# Patient Record
Sex: Female | Born: 1975 | Race: Black or African American | Hispanic: No | Marital: Married | Smoking: Light tobacco smoker
Health system: Southern US, Community
[De-identification: ages and names within clinical notes are randomized; demographics above are authoritative.]

## PROBLEM LIST (undated history)

## (undated) DIAGNOSIS — D649 Anemia, unspecified: Secondary | ICD-10-CM

## (undated) DIAGNOSIS — Z803 Family history of malignant neoplasm of breast: Secondary | ICD-10-CM

## (undated) DIAGNOSIS — E559 Vitamin D deficiency, unspecified: Secondary | ICD-10-CM

## (undated) DIAGNOSIS — N809 Endometriosis, unspecified: Secondary | ICD-10-CM

## (undated) DIAGNOSIS — T7840XA Allergy, unspecified, initial encounter: Secondary | ICD-10-CM

## (undated) DIAGNOSIS — E669 Obesity, unspecified: Secondary | ICD-10-CM

## (undated) DIAGNOSIS — Z5189 Encounter for other specified aftercare: Secondary | ICD-10-CM

## (undated) DIAGNOSIS — J45909 Unspecified asthma, uncomplicated: Secondary | ICD-10-CM

## (undated) DIAGNOSIS — F419 Anxiety disorder, unspecified: Secondary | ICD-10-CM

## (undated) DIAGNOSIS — B019 Varicella without complication: Secondary | ICD-10-CM

## (undated) HISTORY — DX: Endometriosis, unspecified: N80.9

## (undated) HISTORY — DX: Family history of malignant neoplasm of breast: Z80.3

## (undated) HISTORY — DX: Varicella without complication: B01.9

## (undated) HISTORY — DX: Vitamin D deficiency, unspecified: E55.9

## (undated) HISTORY — DX: Encounter for other specified aftercare: Z51.89

## (undated) HISTORY — PX: BREAST SURGERY: SHX581

## (undated) HISTORY — DX: Anemia, unspecified: D64.9

## (undated) HISTORY — DX: Unspecified asthma, uncomplicated: J45.909

## (undated) HISTORY — DX: Obesity, unspecified: E66.9

## (undated) HISTORY — DX: Anxiety disorder, unspecified: F41.9

## (undated) HISTORY — DX: Allergy, unspecified, initial encounter: T78.40XA

---

## 2004-08-02 HISTORY — PX: BREAST BIOPSY: SHX20

## 2005-08-02 HISTORY — PX: OTHER SURGICAL HISTORY: SHX169

## 2005-08-02 HISTORY — PX: TUBAL LIGATION: SHX77

## 2011-02-11 ENCOUNTER — Emergency Department (HOSPITAL_COMMUNITY)
Admission: EM | Admit: 2011-02-11 | Discharge: 2011-02-11 | Disposition: A | Discharge status: Home / Self Care | Payer: 59 | Attending: Emergency Medicine | Admitting: Emergency Medicine

## 2011-02-11 ENCOUNTER — Emergency Department (HOSPITAL_COMMUNITY): Payer: 59

## 2011-02-11 DIAGNOSIS — R0602 Shortness of breath: Secondary | ICD-10-CM | POA: Insufficient documentation

## 2011-02-11 DIAGNOSIS — I498 Other specified cardiac arrhythmias: Secondary | ICD-10-CM | POA: Insufficient documentation

## 2011-02-11 DIAGNOSIS — J45909 Unspecified asthma, uncomplicated: Secondary | ICD-10-CM | POA: Insufficient documentation

## 2011-02-11 DIAGNOSIS — R0789 Other chest pain: Secondary | ICD-10-CM | POA: Insufficient documentation

## 2011-10-27 ENCOUNTER — Ambulatory Visit: Payer: Self-pay | Admitting: Podiatry

## 2012-01-18 ENCOUNTER — Emergency Department: Payer: Self-pay | Admitting: Emergency Medicine

## 2012-01-18 LAB — CBC
MCH: 29.3 pg (ref 26.0–34.0)
MCHC: 34 g/dL (ref 32.0–36.0)
Platelet: 250 10*3/uL (ref 150–440)
RBC: 4.95 10*6/uL (ref 3.80–5.20)
RDW: 13.8 % (ref 11.5–14.5)

## 2012-01-18 LAB — HEPATIC FUNCTION PANEL A (ARMC)
Albumin: 4 g/dL (ref 3.4–5.0)
Alkaline Phosphatase: 48 U/L — ABNORMAL LOW (ref 50–136)
Bilirubin, Direct: 0.1 mg/dL (ref 0.00–0.20)
SGOT(AST): 24 U/L (ref 15–37)
SGPT (ALT): 26 U/L
Total Protein: 8.2 g/dL (ref 6.4–8.2)

## 2012-01-18 LAB — CK TOTAL AND CKMB (NOT AT ARMC): CK, Total: 74 U/L (ref 21–215)

## 2012-01-18 LAB — BASIC METABOLIC PANEL
Anion Gap: 5 — ABNORMAL LOW (ref 7–16)
BUN: 4 mg/dL — ABNORMAL LOW (ref 7–18)
Co2: 27 mmol/L (ref 21–32)
Creatinine: 0.96 mg/dL (ref 0.60–1.30)
Potassium: 3.7 mmol/L (ref 3.5–5.1)

## 2012-08-27 ENCOUNTER — Emergency Department: Payer: Self-pay | Admitting: Unknown Physician Specialty

## 2012-08-27 LAB — CBC
MCHC: 34.7 g/dL (ref 32.0–36.0)
MCV: 87 fL (ref 80–100)
Platelet: 249 10*3/uL (ref 150–440)
RBC: 5.19 10*6/uL (ref 3.80–5.20)
WBC: 8.1 10*3/uL (ref 3.6–11.0)

## 2012-08-27 LAB — URINALYSIS, COMPLETE
Blood: NEGATIVE
Glucose,UR: NEGATIVE mg/dL (ref 0–75)
Protein: 30
WBC UR: 19 /HPF (ref 0–5)

## 2012-08-27 LAB — BASIC METABOLIC PANEL
Anion Gap: 8 (ref 7–16)
BUN: 4 mg/dL — ABNORMAL LOW (ref 7–18)
Chloride: 107 mmol/L (ref 98–107)
Co2: 24 mmol/L (ref 21–32)
EGFR (Non-African Amer.): >60

## 2012-08-27 LAB — HEPATIC FUNCTION PANEL A (ARMC)
Albumin: 4.2 g/dL (ref 3.4–5.0)
Bilirubin, Direct: 0.1 mg/dL (ref 0.00–0.20)
Total Protein: 8.4 g/dL — ABNORMAL HIGH (ref 6.4–8.2)

## 2012-08-27 LAB — PREGNANCY, URINE: Pregnancy Test, Urine: NEGATIVE m[IU]/mL

## 2012-11-23 ENCOUNTER — Encounter: Payer: 59 | Admitting: Obstetrics & Gynecology

## 2012-12-04 ENCOUNTER — Ambulatory Visit (INDEPENDENT_AMBULATORY_CARE_PROVIDER_SITE_OTHER): Payer: 59 | Admitting: Obstetrics & Gynecology

## 2012-12-04 ENCOUNTER — Other Ambulatory Visit: Payer: Self-pay | Admitting: Obstetrics & Gynecology

## 2012-12-04 ENCOUNTER — Encounter: Payer: Self-pay | Admitting: Obstetrics & Gynecology

## 2012-12-04 VITALS — BP 104/69 | HR 53 | Ht 62.0 in | Wt 183.0 lb

## 2012-12-04 DIAGNOSIS — Z0000 Encounter for general adult medical examination without abnormal findings: Secondary | ICD-10-CM

## 2012-12-04 DIAGNOSIS — Z01419 Encounter for gynecological examination (general) (routine) without abnormal findings: Secondary | ICD-10-CM

## 2012-12-04 NOTE — Progress Notes (Signed)
Subjective:    Peggy Pierce is a 37 y.o. female who presents for an annual exam. The patient has no complaints today. She wants screening labs, pap, and mammogram. She says that her periods for the last 4 months have become increasingly painful. She is taking IBU 400 mg.The patient is sexually active. GYN screening history: last pap: was normal. The patient wears seatbelts: yes. The patient participates in regular exercise: no. Has the patient ever been transfused or tattooed?: yes.(transfusion pp) The patient reports that there is not domestic violence in her life.   Menstrual History: OB History   Grav Para Term Preterm Abortions TAB SAB Ect Mult Living                  Menarche age: 64  Patient's last menstrual period was 11/23/2012.    The following portions of the patient's history were reviewed and updated as appropriate: allergies, current medications, past family history, past medical history, past social history, past surgical history and problem list.  Review of Systems A comprehensive review of systems was negative. Married for 3 years, works for [employer]. She denies dyspareunia. Her mother had breast cancer at 11 yo.   Objective:    BP 104/69  Pulse 53  Ht 5\' 2"  (1.575 m)  Wt 183 lb (83.008 kg)  BMI 33.46 kg/m2  LMP 11/23/2012  General Appearance:    Alert, cooperative, no distress, appears stated age  Head:    Normocephalic, without obvious abnormality, atraumatic  Eyes:    PERRL, conjunctiva/corneas clear, EOM's intact, fundi    benign, both eyes  Ears:    Normal TM's and external ear canals, both ears  Nose:   Nares normal, septum midline, mucosa normal, no drainage    or sinus tenderness  Throat:   Lips, mucosa, and tongue normal; teeth and gums normal  Neck:   Supple, symmetrical, trachea midline, no adenopathy;    thyroid:  no enlargement/tenderness/nodules; no carotid   bruit or JVD  Back:     Symmetric, no curvature, ROM normal, no CVA tenderness  Lungs:      Clear to auscultation bilaterally, respirations unlabored  Chest Wall:    No tenderness or deformity   Heart:    Regular rate and rhythm, S1 and S2 normal, no murmur, rub   or gallop  Breast Exam:    No tenderness, masses, or nipple abnormality  Abdomen:     Soft, non-tender, bowel sounds active all four quadrants,    no masses, no organomegaly  Genitalia:    Normal female without lesion, discharge or tenderness, NSSR, NT, mobile, normal adnexa     Extremities:   Extremities normal, atraumatic, no cyanosis or edema  Pulses:   2+ and symmetric all extremities  Skin:   Skin color, texture, turgor normal, no rashes or lesions  Lymph nodes:   Cervical, supraclavicular, and axillary nodes normal  Neurologic:   CNII-XII intact, normal strength, sensation and reflexes    throughout  .    Assessment:    Healthy female exam.    Plan:     Mammogram. Thin prep Pap smear. with HPV cotesting Screening labs when fasting

## 2012-12-14 LAB — PAP LB, HPV-H+LR
HPV DNA High Risk: NEGATIVE
HPV DNA Low Risk: NEGATIVE

## 2013-01-26 ENCOUNTER — Other Ambulatory Visit: Payer: 59

## 2013-06-14 IMAGING — CT CT ABD-PELV W/O CM
1 of 2 series · 15 of 32 positions shown, 19 images · non-contrast
Comparison: none

REASON FOR EXAM: (1) right flank pain; (2) right flank pain
COMMENTS:

[Series 2: 3mm soft tissue · axial · 0.70mm/px · z∈[-422,-24]mm · 15 of 145 slices shown, 19 images]
[im 6/145  soft-tissue]
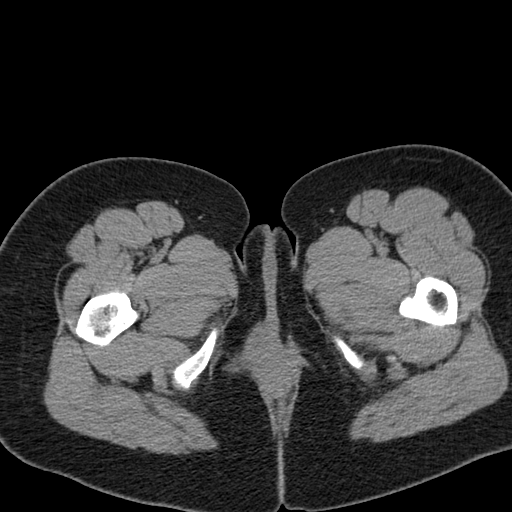
[im 6/145  bone]
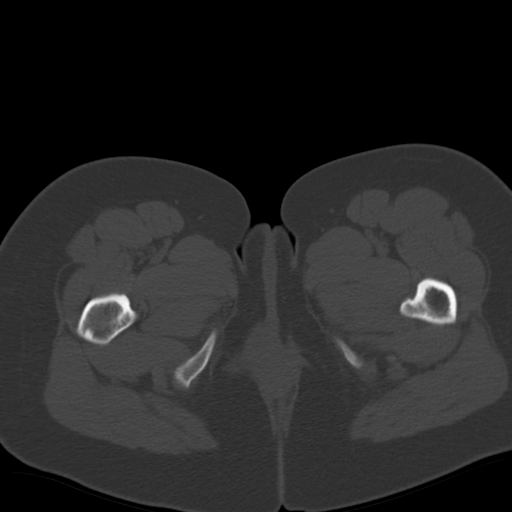
[im 18/145  soft-tissue]
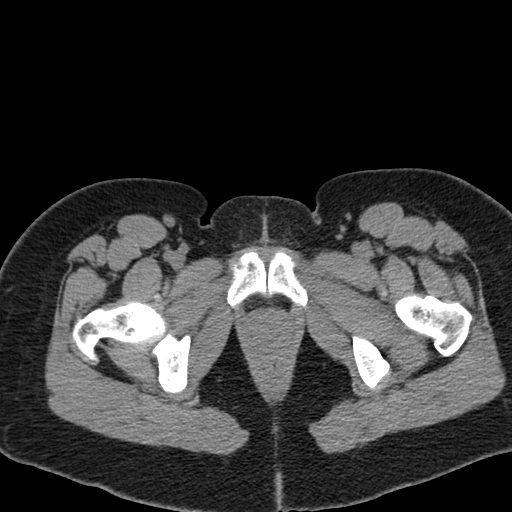
[im 29/145  soft-tissue]
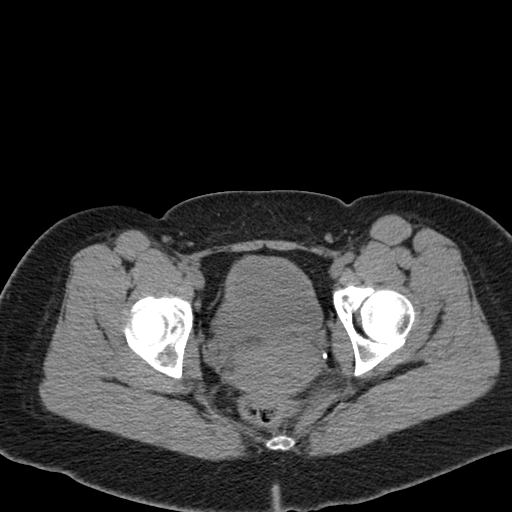
[im 41/145  soft-tissue]
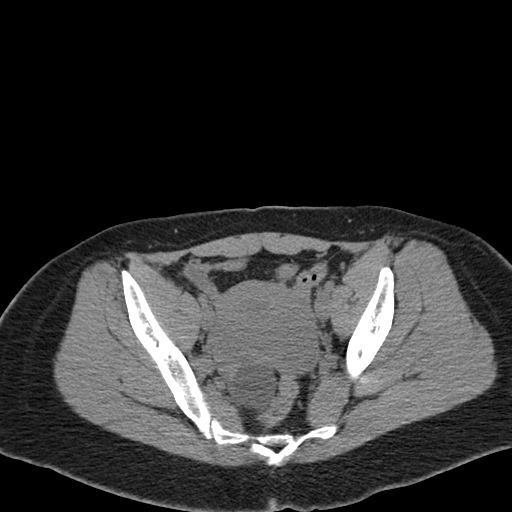
[im 52/145  soft-tissue]
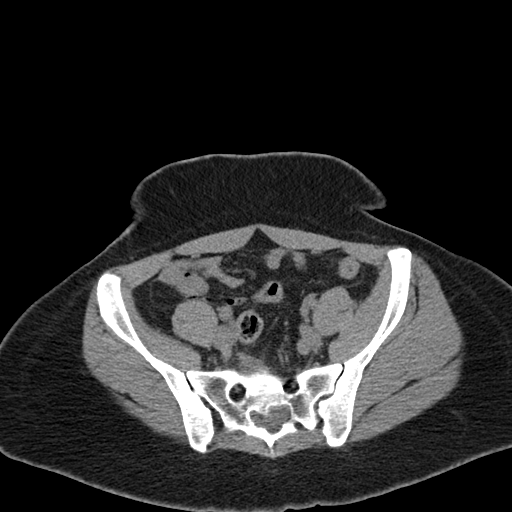
[im 64/145  soft-tissue]
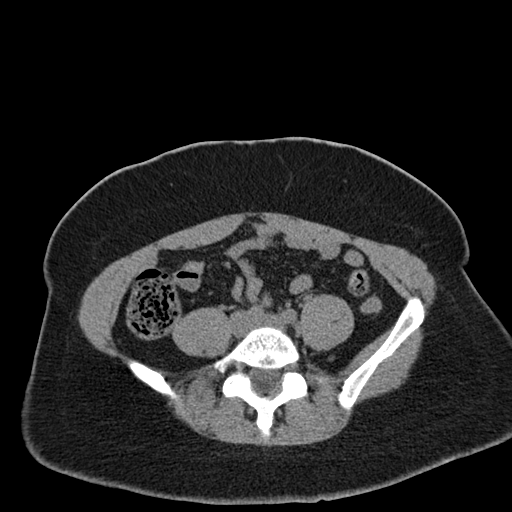
[im 75/145  soft-tissue]
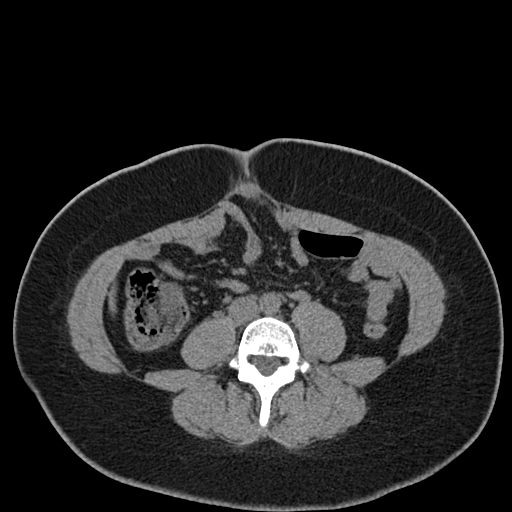
[im 81/145  soft-tissue]
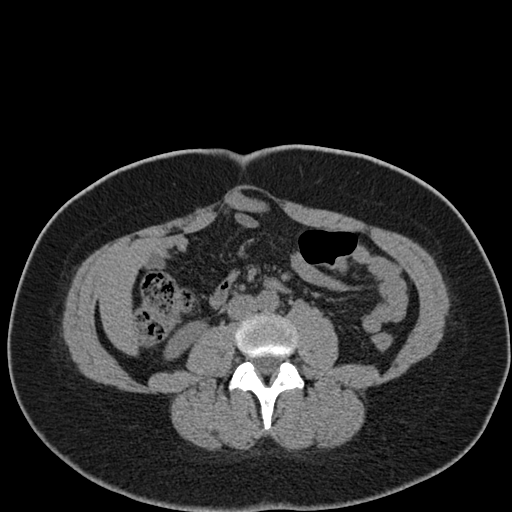
[im 93/145  soft-tissue]
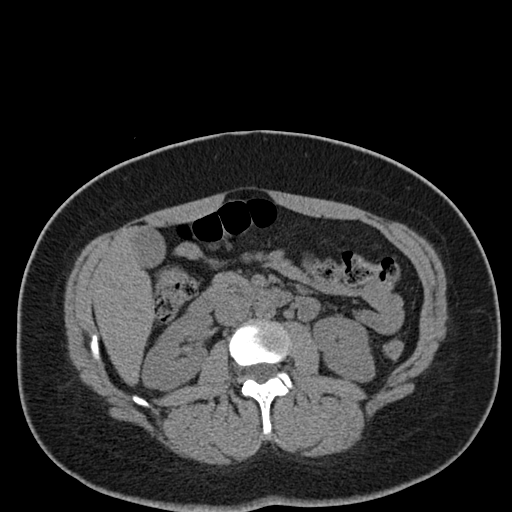
[im 93/145  bone]
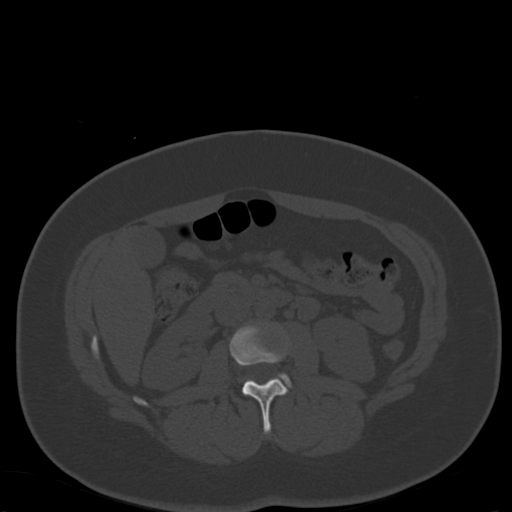
[im 104/145  soft-tissue]
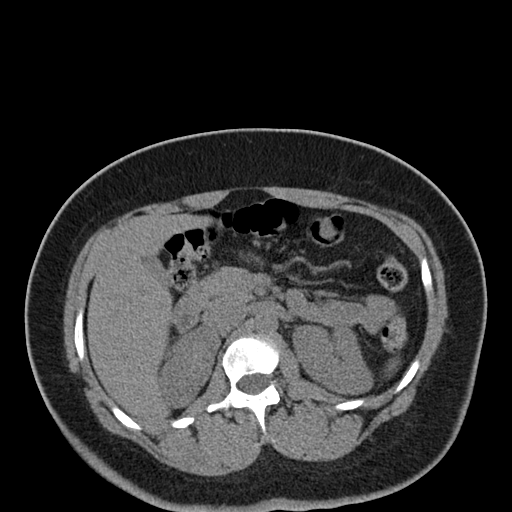
[im 116/145  soft-tissue]
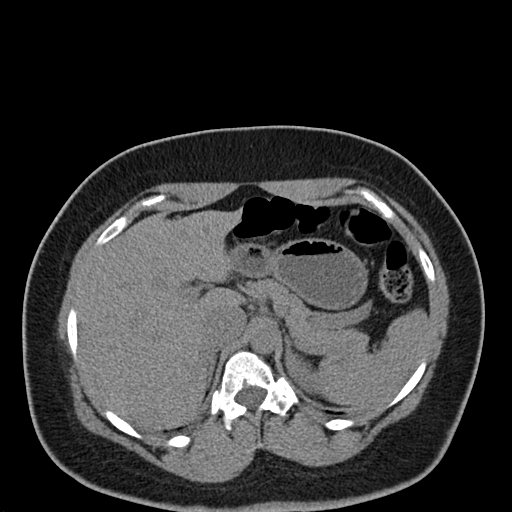
[im 121/145  lung]
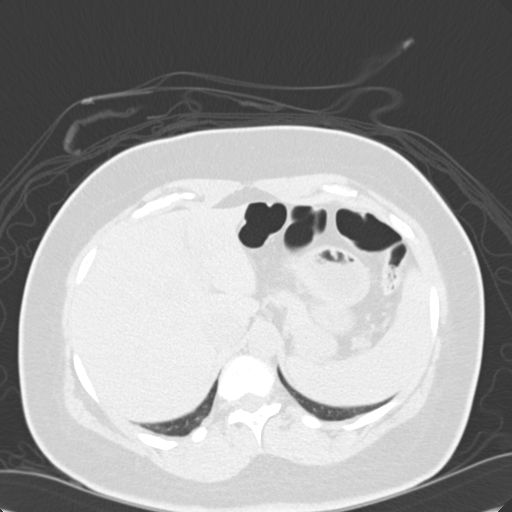
[im 127/145  soft-tissue]
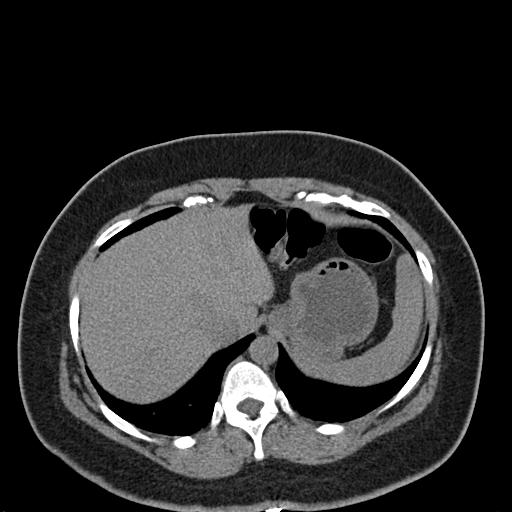
[im 127/145  lung]
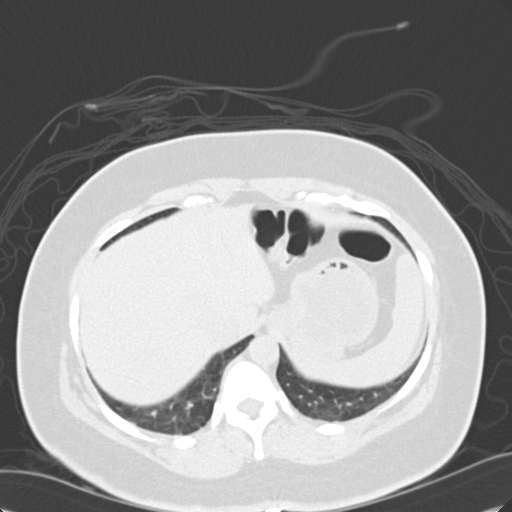
[im 133/145  lung]
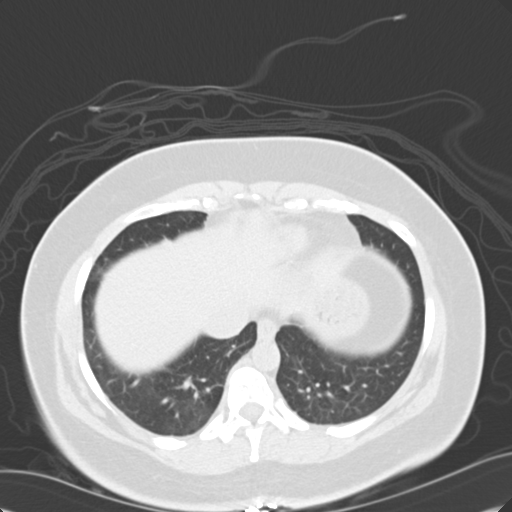
[im 139/145  soft-tissue]
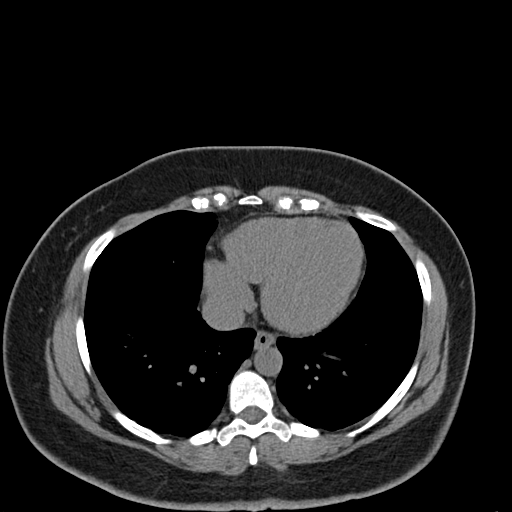
[im 139/145  lung]
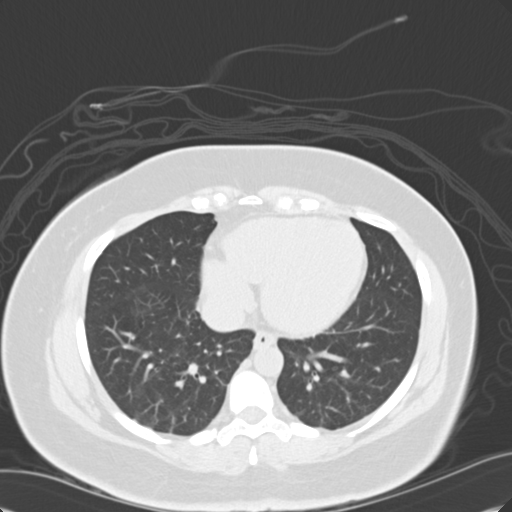

[15 of 32 positions shown; findings below may reference images not displayed]

PROCEDURE:     CT  - CT ABDOMEN AND PELVIS W[DATE]  [DATE]

RESULT:     CT of the abdomen and pelvis utilizing a noncontrast renal stone
protocol is reconstructed at 3 mm slice thickness in the axial plane. There
is no previous similar study for comparison.

There is no hydronephrosis or nephrolithiasis. There is a cyst in the pelvis
posteriorly with a diameter of 3.25 cm and the hamster reading of -11. This
may arise from the right ovary. There does not appear to be significant free
fluid. There is a small to moderate amount of urine in the bladder. There
appears to be a normal appendix. There is no acute inflammatory stranding.
No discrete abscess is evident. No radiopaque gallstones are evident. The
liver, spleen, pancreas, adrenal glands, aorta and kidneys as well as the
bowel otherwise appear to be unremarkable. The bony structures appear within
normal limits.
IMPRESSION: 1. Possible right adnexal cyst. Otherwise unremarkable exam. The lung bases
appear to be clear.

[REDACTED]

## 2016-04-15 ENCOUNTER — Ambulatory Visit (INDEPENDENT_AMBULATORY_CARE_PROVIDER_SITE_OTHER): Payer: 59 | Admitting: Family Medicine

## 2016-04-15 ENCOUNTER — Encounter: Payer: Self-pay | Admitting: Family Medicine

## 2016-04-15 VITALS — BP 134/86 | HR 55 | Temp 98.3°F | Ht 63.0 in | Wt 178.5 lb

## 2016-04-15 DIAGNOSIS — E669 Obesity, unspecified: Secondary | ICD-10-CM | POA: Diagnosis not present

## 2016-04-15 DIAGNOSIS — L918 Other hypertrophic disorders of the skin: Secondary | ICD-10-CM

## 2016-04-15 DIAGNOSIS — M25551 Pain in right hip: Secondary | ICD-10-CM

## 2016-04-15 MED ORDER — DICLOFENAC SODIUM 75 MG PO TBEC: 75.0000 mg | DELAYED_RELEASE_TABLET | Freq: Two times a day (BID) | ORAL | 0 refills | Status: DC

## 2016-04-15 NOTE — Progress Notes (Signed)
Pre visit review using our clinic review tool, if applicable. No additional management support is needed unless otherwise documented below in the visit note. 

## 2016-04-15 NOTE — Patient Instructions (Signed)
Use the diclofenac as needed.  Follow up at your convenience regarding the skin tags.  Take care  Dr. Lacinda Axon   Health Maintenance, Female Adopting a healthy lifestyle and getting preventive care can go a long way to promote health and wellness. Talk with your health care provider about what schedule of regular examinations is right for you. This is a good chance for you to check in with your provider about disease prevention and staying healthy. In between checkups, there are plenty of things you can do on your own. Experts have done a lot of research about which lifestyle changes and preventive measures are most likely to keep you healthy. Ask your health care provider for more information. WEIGHT AND DIET  Eat a healthy diet  Be sure to include plenty of vegetables, fruits, low-fat dairy products, and lean protein.  Do not eat a lot of foods high in solid fats, added sugars, or salt.  Get regular exercise. This is one of the most important things you can do for your health.  Most adults should exercise for at least 150 minutes each week. The exercise should increase your heart rate and make you sweat (moderate-intensity exercise).  Most adults should also do strengthening exercises at least twice a week. This is in addition to the moderate-intensity exercise.  Maintain a healthy weight  Body mass index (BMI) is a measurement that can be used to identify possible weight problems. It estimates body fat based on height and weight. Your health care provider can help determine your BMI and help you achieve or maintain a healthy weight.  For females 11 years of age and older:   A BMI below 18.5 is considered underweight.  A BMI of 18.5 to 24.9 is normal.  A BMI of 25 to 29.9 is considered overweight.  A BMI of 30 and above is considered obese.  Watch levels of cholesterol and blood lipids  You should start having your blood tested for lipids and cholesterol at 38 years of age, then  have this test every 5 years.  You may need to have your cholesterol levels checked more often if:  Your lipid or cholesterol levels are high.  You are older than 37 years of age.  You are at high risk for heart disease.  CANCER SCREENING   Lung Cancer  Lung cancer screening is recommended for adults 78-33 years old who are at high risk for lung cancer because of a history of smoking.  A yearly low-dose CT scan of the lungs is recommended for people who:  Currently smoke.  Have quit within the past 15 years.  Have at least a 30-pack-year history of smoking. A pack year is smoking an average of one pack of cigarettes a day for 1 year.  Yearly screening should continue until it has been 15 years since you quit.  Yearly screening should stop if you develop a health problem that would prevent you from having lung cancer treatment.  Breast Cancer  Practice breast self-awareness. This means understanding how your breasts normally appear and feel.  It also means doing regular breast self-exams. Let your health care provider know about any changes, no matter how small.  If you are in your 20s or 30s, you should have a clinical breast exam (CBE) by a health care provider every 1-3 years as part of a regular health exam.  If you are 9 or older, have a CBE every year. Also consider having a breast X-ray (mammogram) every  year.  If you have a family history of breast cancer, talk to your health care provider about genetic screening.  If you are at high risk for breast cancer, talk to your health care provider about having an MRI and a mammogram every year.  Breast cancer gene (BRCA) assessment is recommended for women who have family members with BRCA-related cancers. BRCA-related cancers include:  Breast.  Ovarian.  Tubal.  Peritoneal cancers.  Results of the assessment will determine the need for genetic counseling and BRCA1 and BRCA2 testing. Cervical Cancer Your health  care provider may recommend that you be screened regularly for cancer of the pelvic organs (ovaries, uterus, and vagina). This screening involves a pelvic examination, including checking for microscopic changes to the surface of your cervix (Pap test). You may be encouraged to have this screening done every 3 years, beginning at age 73.  For women ages 61-65, health care providers may recommend pelvic exams and Pap testing every 3 years, or they may recommend the Pap and pelvic exam, combined with testing for human papilloma virus (HPV), every 5 years. Some types of HPV increase your risk of cervical cancer. Testing for HPV may also be done on women of any age with unclear Pap test results.  Other health care providers may not recommend any screening for nonpregnant women who are considered low risk for pelvic cancer and who do not have symptoms. Ask your health care provider if a screening pelvic exam is right for you.  If you have had past treatment for cervical cancer or a condition that could lead to cancer, you need Pap tests and screening for cancer for at least 20 years after your treatment. If Pap tests have been discontinued, your risk factors (such as having a new sexual partner) need to be reassessed to determine if screening should resume. Some women have medical problems that increase the chance of getting cervical cancer. In these cases, your health care provider may recommend more frequent screening and Pap tests. Colorectal Cancer  This type of cancer can be detected and often prevented.  Routine colorectal cancer screening usually begins at 38 years of age and continues through 38 years of age.  Your health care provider may recommend screening at an earlier age if you have risk factors for colon cancer.  Your health care provider may also recommend using home test kits to check for hidden blood in the stool.  A small camera at the end of a tube can be used to examine your colon  directly (sigmoidoscopy or colonoscopy). This is done to check for the earliest forms of colorectal cancer.  Routine screening usually begins at age 35.  Direct examination of the colon should be repeated every 5-10 years through 38 years of age. However, you may need to be screened more often if early forms of precancerous polyps or small growths are found. Skin Cancer  Check your skin from head to toe regularly.  Tell your health care provider about any new moles or changes in moles, especially if there is a change in a mole's shape or color.  Also tell your health care provider if you have a mole that is larger than the size of a pencil eraser.  Always use sunscreen. Apply sunscreen liberally and repeatedly throughout the day.  Protect yourself by wearing long sleeves, pants, a wide-brimmed hat, and sunglasses whenever you are outside. HEART DISEASE, DIABETES, AND HIGH BLOOD PRESSURE   High blood pressure causes heart disease and increases  the risk of stroke. High blood pressure is more likely to develop in:  People who have blood pressure in the high end of the normal range (130-139/85-89 mm Hg).  People who are overweight or obese.  People who are African American.  If you are 85-10 years of age, have your blood pressure checked every 3-5 years. If you are 98 years of age or older, have your blood pressure checked every year. You should have your blood pressure measured twice--once when you are at a hospital or clinic, and once when you are not at a hospital or clinic. Record the average of the two measurements. To check your blood pressure when you are not at a hospital or clinic, you can use:  An automated blood pressure machine at a pharmacy.  A home blood pressure monitor.  If you are between 67 years and 3 years old, ask your health care provider if you should take aspirin to prevent strokes.  Have regular diabetes screenings. This involves taking a blood sample to check  your fasting blood sugar level.  If you are at a normal weight and have a low risk for diabetes, have this test once every three years after 38 years of age.  If you are overweight and have a high risk for diabetes, consider being tested at a younger age or more often. PREVENTING INFECTION  Hepatitis B  If you have a higher risk for hepatitis B, you should be screened for this virus. You are considered at high risk for hepatitis B if:  You were born in a country where hepatitis B is common. Ask your health care provider which countries are considered high risk.  Your parents were born in a high-risk country, and you have not been immunized against hepatitis B (hepatitis B vaccine).  You have HIV or AIDS.  You use needles to inject street drugs.  You live with someone who has hepatitis B.  You have had sex with someone who has hepatitis B.  You get hemodialysis treatment.  You take certain medicines for conditions, including cancer, organ transplantation, and autoimmune conditions. Hepatitis C  Blood testing is recommended for:  Everyone born from [birth year] through 1963.  Anyone with known risk factors for hepatitis C. Sexually transmitted infections (STIs)  You should be screened for sexually transmitted infections (STIs) including gonorrhea and chlamydia if:  You are sexually active and are younger than 38 years of age.  You are older than 38 years of age and your health care provider tells you that you are at risk for this type of infection.  Your sexual activity has changed since you were last screened and you are at an increased risk for chlamydia or gonorrhea. Ask your health care provider if you are at risk.  If you do not have HIV, but are at risk, it may be recommended that you take a prescription medicine daily to prevent HIV infection. This is called pre-exposure prophylaxis (PrEP). You are considered at risk if:  You are sexually active and do not regularly use  condoms or know the HIV status of your partner(s).  You take drugs by injection.  You are sexually active with a partner who has HIV. Talk with your health care provider about whether you are at high risk of being infected with HIV. If you choose to begin PrEP, you should first be tested for HIV. You should then be tested every 3 months for as long as you are taking PrEP.  PREGNANCY  If you are premenopausal and you may become pregnant, ask your health care provider about preconception counseling.  If you may become pregnant, take 400 to 800 micrograms (mcg) of folic acid every day.  If you want to prevent pregnancy, talk to your health care provider about birth control (contraception). OSTEOPOROSIS AND MENOPAUSE   Osteoporosis is a disease in which the bones lose minerals and strength with aging. This can result in serious bone fractures. Your risk for osteoporosis can be identified using a bone density scan.  If you are 65 years of age or older, or if you are at risk for osteoporosis and fractures, ask your health care provider if you should be screened.  Ask your health care provider whether you should take a calcium or vitamin D supplement to lower your risk for osteoporosis.  Menopause may have certain physical symptoms and risks.  Hormone replacement therapy may reduce some of these symptoms and risks. Talk to your health care provider about whether hormone replacement therapy is right for you.  HOME CARE INSTRUCTIONS   Schedule regular health, dental, and eye exams.  Stay current with your immunizations.   Do not use any tobacco products including cigarettes, chewing tobacco, or electronic cigarettes.  If you are pregnant, do not drink alcohol.  If you are breastfeeding, limit how much and how often you drink alcohol.  Limit alcohol intake to no more than 1 drink per day for nonpregnant women. One drink equals 12 ounces of beer, 5 ounces of wine, or 1 ounces of hard  liquor.  Do not use street drugs.  Do not share needles.  Ask your health care provider for help if you need support or information about quitting drugs.  Tell your health care provider if you often feel depressed.  Tell your health care provider if you have ever been abused or do not feel safe at home.   This information is not intended to replace advice given to you by your health care provider. Make sure you discuss any questions you have with your health care provider.   Document Released: 02/01/2011 Document Revised: 08/09/2014 Document Reviewed: 06/20/2013 Elsevier Interactive Patient Education 2016 [employer] Inc[employer]  

## 2016-04-16 DIAGNOSIS — L918 Other hypertrophic disorders of the skin: Secondary | ICD-10-CM | POA: Insufficient documentation

## 2016-04-16 DIAGNOSIS — M25551 Pain in right hip: Secondary | ICD-10-CM | POA: Insufficient documentation

## 2016-04-16 NOTE — Assessment & Plan Note (Signed)
New problem. Suspect piriformis syndrome. Treating with PRN Diclofenac.

## 2016-04-16 NOTE — Progress Notes (Signed)
Subjective:  Patient ID: Peggy Pierce, female    DOB: January 07, 1976  Age: 40 y.o. MRN: [id number]  CC: Establish care  HPI Peggy Pierce is a 40 y.o. female presents to the clinic today to establish care. Issues/concerns are below.  Buttock/Hip pain  Patient reports right hip pain/buttock pain.  She states that this is been going on intermittently for the past year.  No known exacerbating or relieving factors.  Patient states that she has a history of this.  No medications or interventions tried.  Mild to moderate in severity.  Skin tags  Patient reports that she has a few skin tags in/around her left axilla.  She states that they are irritating often catch on her clothing.  Patient would like to discuss removal.  PMH, Surgical Hx, Family Hx, Social History reviewed and updated as below.  Past Medical History:  Diagnosis Date  . Allergy   . Asthma   . Chicken pox   . Obesity    Past Surgical History:  Procedure Laterality Date  . BREAST SURGERY    . Pelvic adhesion surgery  2007  . TUBAL LIGATION Bilateral 2007   Family History  Problem Relation Age of Onset  . Hypertension Mother   . Cancer Mother 42    one breast/tn  . Stroke Mother   . Diabetes Father   . Hypertension Father   . Cancer Father 73    liver cancer  . Lung cancer Father   . Hyperlipidemia Father   . Stroke Father   . Asthma Daughter   . Cancer Maternal Grandmother     lung cancer  . Cancer Maternal Grandfather     lung cancer  . Diabetes Paternal Grandfather   . Hypertension Paternal Grandfather    Social History  Substance Use Topics  . Smoking status: Light Tobacco Smoker    Packs/day: 0.25    Years: 10.00    Types: Cigarettes  . Smokeless tobacco: Never Used  . Alcohol use Yes     Comment: rare   Review of Systems  Musculoskeletal:       Right hip/buttock pain.  Skin:       Skin tags.  Neurological: Positive for headaches.  All other systems reviewed and are  negative.  Objective:   Today's Vitals: BP 134/86 (BP Location: Right Arm, Patient Position: Sitting, Cuff Size: Normal)   Pulse (!) 55   Temp 98.3 F (36.8 C) (Oral)   Ht 5\' 3"  (1.6 m)   Wt 178 lb 8 oz (81 kg)   SpO2 98%   BMI 31.62 kg/m   Physical Exam  Constitutional: She is oriented to person, place, and time. She appears well-developed. No distress.  HENT:  Head: Normocephalic and atraumatic.  Mouth/Throat: Oropharynx is clear and moist.  Eyes: Conjunctivae are normal. Pupils are equal, round, and reactive to light. No scleral icterus.  Neck: Neck supple.  Cardiovascular: Normal rate and regular rhythm.   Pulmonary/Chest: Effort normal and breath sounds normal.  Abdominal: Soft. She exhibits no distension. There is no tenderness. There is no rebound.  Musculoskeletal:  Right hip - no greater trochanter tenderness. Mild pain with straight leg raise. Mild tenderness to palpation of the the piriformis muscle.   Lymphadenopathy:    She has no cervical adenopathy.  Neurological: She is alert and oriented to person, place, and time.  Skin:  Left axilla - 2 small skin tags noted.  Psychiatric: She has a normal mood and affect.  Vitals reviewed.  Assessment & Plan:   Problem List Items Addressed This Visit    Obesity (BMI 30.0-34.9)   Hip pain - Primary    New problem. Suspect piriformis syndrome. Treating with PRN Diclofenac.      Cutaneous skin tags    New problem. Advised to follow up for removal.       Other Visit Diagnoses   None.     Outpatient Encounter Prescriptions as of 04/15/2016  Medication Sig  . diclofenac (VOLTAREN) 75 MG EC tablet Take 1 tablet (75 mg total) by mouth 2 (two) times daily.   No facility-administered encounter medications on file as of 04/15/2016.     Follow-up: Annually or sooner if needed.   Everlene OtherJayce Derwood Becraft DO [facility] [place]

## 2016-04-16 NOTE — Assessment & Plan Note (Signed)
New problem. Advised to follow up for removal.

## 2016-04-30 ENCOUNTER — Other Ambulatory Visit: Payer: Self-pay | Admitting: Family Medicine

## 2016-05-01 LAB — CBC
HEMATOCRIT: 40.9 % (ref 34.0–46.6)
HEMOGLOBIN: 14.3 g/dL (ref 11.1–15.9)
MCH: 29.4 pg (ref 26.6–33.0)
MCHC: 35 g/dL (ref 31.5–35.7)
MCV: 84 fL (ref 79–97)
Platelets: 219 10*3/uL (ref 150–379)
RBC: 4.87 x10E6/uL (ref 3.77–5.28)
RDW: 14.1 % (ref 12.3–15.4)
WBC: 6.1 10*3/uL (ref 3.4–10.8)

## 2016-05-01 LAB — COMPREHENSIVE METABOLIC PANEL
ALK PHOS: 39 IU/L (ref 39–117)
ALT: 21 IU/L (ref 0–32)
AST: 20 IU/L (ref 0–40)
Albumin/Globulin Ratio: 1.4 (ref 1.2–2.2)
Albumin: 4.4 g/dL (ref 3.5–5.5)
BILIRUBIN TOTAL: 0.4 mg/dL (ref 0.0–1.2)
BUN/Creatinine Ratio: 4 — ABNORMAL LOW (ref 9–23)
BUN: 4 mg/dL — AB (ref 6–20)
CHLORIDE: 102 mmol/L (ref 96–106)
CO2: 21 mmol/L (ref 18–29)
Calcium: 8.9 mg/dL (ref 8.7–10.2)
Creatinine, Ser: 0.94 mg/dL (ref 0.57–1.00)
GFR calc Af Amer: 89 mL/min/{1.73_m2} (ref >59)
GFR calc non Af Amer: 77 mL/min/{1.73_m2} (ref >59)
GLUCOSE: 87 mg/dL (ref 65–99)
Globulin, Total: 3.1 g/dL (ref 1.5–4.5)
Potassium: 3.8 mmol/L (ref 3.5–5.2)
Sodium: 139 mmol/L (ref 134–144)
Total Protein: 7.5 g/dL (ref 6.0–8.5)

## 2016-05-01 LAB — LIPID PANEL W/O CHOL/HDL RATIO
CHOLESTEROL TOTAL: 129 mg/dL (ref 100–199)
HDL: 34 mg/dL — AB (ref >39)
LDL CALC: 73 mg/dL (ref 0–99)
TRIGLYCERIDES: 112 mg/dL (ref 0–149)
VLDL Cholesterol Cal: 22 mg/dL (ref 5–40)

## 2016-05-01 LAB — HGB A1C W/O EAG: HEMOGLOBIN A1C: 5.2 % (ref 4.8–5.6)

## 2016-05-04 ENCOUNTER — Other Ambulatory Visit: Payer: Self-pay | Admitting: Family Medicine

## 2016-05-04 MED ORDER — DIAZEPAM 10 MG PO TABS: 10.0000 mg | ORAL_TABLET | Freq: Once | ORAL | 0 refills | Status: AC

## 2016-05-04 NOTE — Progress Notes (Signed)
Faxed

## 2016-05-07 ENCOUNTER — Telehealth: Payer: Self-pay | Admitting: Family Medicine

## 2016-05-07 ENCOUNTER — Other Ambulatory Visit: Payer: Self-pay | Admitting: Family Medicine

## 2016-05-07 MED ORDER — DIAZEPAM 10 MG PO TABS: 10.0000 mg | ORAL_TABLET | Freq: Once | ORAL | 0 refills | Status: AC

## 2016-05-07 NOTE — Telephone Encounter (Signed)
Pt was given a new rx for the valium.

## 2016-05-07 NOTE — Telephone Encounter (Signed)
Pt called stating that she needs another Rx to come back on the plane she was given one to go but needs one to return. Medication is diazapam.   Pharm[facility] is [id number] [address] [facility] - [place], [place] - [address]  Call pt @ [phone]. Thank you!

## 2016-05-21 ENCOUNTER — Ambulatory Visit: Payer: 59 | Admitting: Family Medicine

## 2016-07-29 ENCOUNTER — Telehealth: Payer: Self-pay | Admitting: Family Medicine

## 2016-07-29 NOTE — Telephone Encounter (Signed)
Patient advised to go to urgent care to get symptoms further evaluated . Patient verbalized understanding.

## 2016-07-29 NOTE — Telephone Encounter (Signed)
Pt called c/o possible flu has cough, congestion, achy joints, rt ear ache, sore throat. No available appts. Please advise, thank you!  Call pt @ [phone] (desk) [phone] (cell)

## 2016-11-30 ENCOUNTER — Ambulatory Visit (INDEPENDENT_AMBULATORY_CARE_PROVIDER_SITE_OTHER): Payer: 59

## 2016-11-30 ENCOUNTER — Ambulatory Visit (INDEPENDENT_AMBULATORY_CARE_PROVIDER_SITE_OTHER): Payer: 59 | Admitting: Family Medicine

## 2016-11-30 ENCOUNTER — Other Ambulatory Visit: Payer: Self-pay | Admitting: Family Medicine

## 2016-11-30 ENCOUNTER — Encounter: Payer: Self-pay | Admitting: Family Medicine

## 2016-11-30 VITALS — BP 114/80 | HR 67 | Temp 98.5°F | Wt 182.4 lb

## 2016-11-30 DIAGNOSIS — M545 Low back pain, unspecified: Secondary | ICD-10-CM

## 2016-11-30 DIAGNOSIS — G8929 Other chronic pain: Secondary | ICD-10-CM | POA: Diagnosis not present

## 2016-11-30 DIAGNOSIS — M25551 Pain in right hip: Secondary | ICD-10-CM

## 2016-11-30 NOTE — Patient Instructions (Signed)
We will call with the xray results and will go from there.  Take care  Dr. Adriana Simas

## 2016-11-30 NOTE — Progress Notes (Signed)
Pre visit review using our clinic review tool, if applicable. No additional management support is needed unless otherwise documented below in the visit note. 

## 2016-12-01 DIAGNOSIS — G8929 Other chronic pain: Secondary | ICD-10-CM | POA: Insufficient documentation

## 2016-12-01 DIAGNOSIS — M545 Low back pain, unspecified: Secondary | ICD-10-CM | POA: Insufficient documentation

## 2016-12-01 NOTE — Assessment & Plan Note (Signed)
Established problem, worsening. X-ray obtained today and was normal. Sending to physical therapy. Ibuprofen as needed for pain.

## 2016-12-01 NOTE — Assessment & Plan Note (Signed)
New problem. X-ray revealed mild scoliosis. Otherwise unremarkable. Sending to physical therapy.

## 2016-12-01 NOTE — Progress Notes (Signed)
Subjective:  Patient ID: Peggy Pierce, female    DOB: 1976-02-04  Age: 41 y.o. MRN: [id number]  CC: Back/Hip Pain  HPI:  41 year old female presents with above complaints.  Patient has seen me previously for hip pain. She was treated with diclofenac as I felt that this was primarily piriformis syndrome.   Patient states that since our last visit she's had continued right hip pain. She also endorses low back pain. She states that the pain occurs in her back and buttock and also in the groin. No real radiation. No complaints of numbness or tingling. Patient seems to think that it's worse prior to and during her menses. She states it's intermittent. Severe at times. She has taken ibuprofen with some improvement. No other associated symptoms. No other complaints or concerns at this time.  Social Hx   Social History   Social History  . Marital status: Married    Spouse name: N/A  . Number of children: N/A  . Years of education: N/A   Social History Main Topics  . Smoking status: Light Tobacco Smoker    Packs/day: 0.25    Years: 10.00    Types: Cigarettes  . Smokeless tobacco: Never Used  . Alcohol use Yes     Comment: rare  . Drug use: No  . Sexual activity: Yes    Partners: Male    Birth control/ protection: Surgical   Other Topics Concern  . None   Social History Narrative  . None    Review of Systems  Musculoskeletal: Positive for back pain.       Right hip pain.   Objective:  BP 114/80   Pulse 67   Temp 98.5 F (36.9 C)   Wt 182 lb 6.4 oz (82.7 kg)   SpO2 98%   BMI 32.31 kg/m   BP/Weight 11/30/2016 04/15/2016 12/04/2012  Systolic BP 114 134 104  Diastolic BP 80 86 69  Wt. (Lbs) 182.4 178.5 183  BMI 32.31 31.62 33.46    Physical Exam  Constitutional: She is oriented to person, place, and time. She appears well-developed. No distress.  Cardiovascular: Normal rate and regular rhythm.   Pulmonary/Chest: Effort normal and breath sounds normal.  Musculoskeletal:   Significant pain with FADIR. The greater trochanter tenderness. Tenderness at the right SI joint. Negative straight leg raise.  Neurological: She is alert and oriented to person, place, and time.  Psychiatric: She has a normal mood and affect.  Vitals reviewed.   Lab Results  Component Value Date   WBC 6.1 04/30/2016   HGB 15.6 08/27/2012   HCT 40.9 04/30/2016   PLT 219 04/30/2016   GLUCOSE 87 04/30/2016   CHOL 129 04/30/2016   TRIG 112 04/30/2016   HDL 34 (L) 04/30/2016   LDLCALC 73 04/30/2016   ALT 21 04/30/2016   AST 20 04/30/2016   NA 139 04/30/2016   K 3.8 04/30/2016   CL 102 04/30/2016   CREATININE 0.94 04/30/2016   BUN 4 (L) 04/30/2016   CO2 21 04/30/2016   HGBA1C 5.2 04/30/2016   Dg Lumbar Spine Complete  Result Date: 11/30/2016 CLINICAL DATA:  Low back and right hip and SI joint pain for 6 months. No known injury. EXAM: LUMBAR SPINE - COMPLETE 4+ VIEW COMPARISON:  None. FINDINGS: There is convex left scoliosis with the apex at L3. Vertebral body height and alignment are maintained. No pars defect is identified. Intervertebral disc space height is normal. Paraspinous structures are unremarkable. IMPRESSION: Convex left scoliosis  with the apex at L3. The study is otherwise negative. Electronically Signed   By: Drusilla Kanner M.D.   On: 11/30/2016 15:14   Dg Hip Unilat W Or W/o Pelvis 1v Right  Result Date: 11/30/2016 CLINICAL DATA:  Low back and right hip and SI joint pain for 6 months. No known injury. EXAM: DG HIP (WITH OR WITHOUT PELVIS) 1V RIGHT COMPARISON:  None. FINDINGS: There is no evidence of hip fracture or dislocation. There is no evidence of arthropathy or other focal bone abnormality. IMPRESSION: Normal exam. Electronically Signed   By: Drusilla Kanner M.D.   On: 11/30/2016 15:16   Assessment & Plan:   Problem List Items Addressed This Visit    Chronic right-sided low back pain without sciatica - Primary    New problem. X-ray revealed mild scoliosis.  Otherwise unremarkable. Sending to physical therapy.      Relevant Orders   DG Lumbar Spine Complete (Completed)   Ambulatory referral to Physical Therapy   Right hip pain    Established problem, worsening. X-ray obtained today and was normal. Sending to physical therapy. Ibuprofen as needed for pain.      Relevant Orders   DG HIP UNILAT W OR W/O PELVIS 1V RIGHT (Completed)   Ambulatory referral to Physical Therapy     Follow-up: PRN  Everlene Other DO [facility]

## 2016-12-06 ENCOUNTER — Other Ambulatory Visit: Payer: Self-pay | Admitting: Family Medicine

## 2016-12-06 ENCOUNTER — Telehealth: Payer: Self-pay | Admitting: Family Medicine

## 2016-12-06 MED ORDER — PREDNISONE 10 MG (48) PO TBPK: ORAL_TABLET | ORAL | 0 refills | Status: DC

## 2016-12-06 NOTE — Telephone Encounter (Signed)
Pt saw Dr. Adriana Simasook last week. She has changed her mind about the steroid. Pt would like a steroid  prescription .

## 2016-12-06 NOTE — Telephone Encounter (Signed)
Rx sent 

## 2016-12-22 ENCOUNTER — Ambulatory Visit: Payer: 59 | Attending: Family Medicine | Admitting: Physical Therapy

## 2016-12-22 DIAGNOSIS — M25551 Pain in right hip: Secondary | ICD-10-CM | POA: Insufficient documentation

## 2016-12-22 DIAGNOSIS — M545 Low back pain, unspecified: Secondary | ICD-10-CM

## 2016-12-22 DIAGNOSIS — G8929 Other chronic pain: Secondary | ICD-10-CM | POA: Diagnosis present

## 2016-12-22 DIAGNOSIS — R1031 Right lower quadrant pain: Secondary | ICD-10-CM | POA: Diagnosis present

## 2016-12-22 NOTE — Therapy (Signed)
[place] [facility] [address] [place], [address] Phone: [phone]   Fax:  [phone]  Physical Therapy Evaluation  Patient Details  Name: Peggy Pierce MRN: [id number] Date of Birth: 1976/04/27 No Data Recorded  Encounter Date: 12/22/2016      PT End of Session - 12/22/16 1046    Visit Number 1   Number of Visits 13   Date for PT Re-Evaluation 02/09/17   PT Start Time 0935   PT Stop Time 1040   PT Time Calculation (min) 65 min   Activity Tolerance Patient tolerated treatment well;Patient limited by pain   Behavior During Therapy [facility] for tasks assessed/performed      Past Medical History:  Diagnosis Date  . Allergy   . Asthma   . Chicken pox   . Obesity     Past Surgical History:  Procedure Laterality Date  . BREAST SURGERY    . Pelvic adhesion surgery  2007  . TUBAL LIGATION Bilateral 2007    There were no vitals filed for this visit.       Subjective Assessment - 12/22/16 1057    Subjective Patient reports roughly 1 year ago she began developing pain in the R buttock, groin, and lower back. The groin and hip pain seem to be aggravated by prolonged sitting/standing. She reports her low back pain is provoked by prolonged walking. She states her pain is always present, but is exacerbated in the week after her period. She has a history of two vaginal child births, though her children are now 7 and 50. She denies any constitutional signs, or falls. She has only found relief from warm baths.    Limitations Lifting;Walking;House hold activities;Sitting;Standing   Diagnostic tests X-ray per PT appears to have less weightbearing on R side, scoliosis    Patient Stated Goals To be rid of her pain.    Currently in Pain? Yes   Pain Score 3    Pain Location Hip   Pain Orientation Right   Pain Descriptors / Indicators Aching   Pain Type Chronic pain   Pain Onset More than a month ago   Pain Frequency  Constant   Aggravating Factors  Prolonged sitting/standing/walking.    Pain Relieving Factors Hot bath.       Squat assessment - pain on descent, no significant valgus  LEFS- 57/80 Great toe extension - WNL Ankle PF/DF 5/5   Hip flexion/IR/ER -- 4/5 though all pain limited  Knee flexion/extension - all 5/5 but painful  On R side   Sidelying hip abduction on R - 4/5   Gait assessment- patient has lateral trunk flexion over RLE in R stance phase, abbreviated R stance phase.   SLR- uncomfortable, but not a true positive   CPAs reports sensation of relief around T10, became progressively more uncomfortable as PAs provided distally into lumbar spine. Mobility was WNL.   Ely's test - negative bilaterally  Passive prone hip extension  Hip flexion - painful on R side  Palpation - painful on superior gluteal insertion, lateral gluteal tendon near greater trochanter, TFL.   Manual Long axis distraction and manipulation x 2 minutes with 3 long axis manipulations provided, she reports relief of pain/symptoms in her lumbar spine but reports increased pain in posterior hip musculature.  Supine isometric clamshells x 10 for 2 sets for 5" holds  Lumbar rotational mobilizations -patient reported feeling of relief in lumbar spine, but increased symptoms in her hip.  Prone on elbows x 15" x 5 repetitions (no real change in symptoms, perhaps slight decrease in lumbar pain).                          PT Education - 12/22/16 1055    Education provided Yes   Education Details Unclear source of pain, will use treatments to continue assessment, may refer to pelvic therapist if warranted.    Person(s) Educated Patient   Methods Demonstration;Explanation;Handout   Comprehension Verbalized understanding;Returned demonstration             PT Long Term Goals - 12/22/16 1047      PT LONG TERM GOAL #1   Title Patient will report worst pain of no more than 3/10 to demonstrate  improved tolerance for ADLs.    Time 6   Period Weeks   Status New     PT LONG TERM GOAL #2   Title Patient will report no pain with ambulation > 15 minutes to demonstrate improved tolerance for ADLs.    Time 6   Period Weeks   Status New     PT LONG TERM GOAL #3   Title Patient will report LEFS score of greater than 70/80 to demonstrate improved tolerance for ADLs.    Time 6   Period Weeks   Status New               Plan - 12/22/16 1048    Clinical Impression Statement Patient reports insidious onset of R hip, groin, and back pain roughly 1 month ago, with no obvious MOI. She associates her symptoms with the week after her periods, she has a history of 2 vaginal births, though these were some time ago. Upon examination today she is painful with all testing of the hip musculature, range of motion, and palpation of the soft tissue. There is no obvious collection of symptoms given her pain with all movements, though she does not seem to have any obvious red flags either. Her X-rays indicate she is not weightbearing through her RLE as much as LLE. This issue appears to be musculo-skeletal, though more attention to the SIJ and pelvis is likely warranted, which will be addressed in follow up. She will likely benefit from skilled PT services to address her pain and allow for return to functional activities.    Rehab Potential Good   PT Frequency 2x / week   PT Duration 6 weeks   PT Treatment/Interventions Aquatic Therapy;ADLs/Self Care Home Management;Iontophoresis 4mg /ml Dexamethasone;Cryotherapy;Microbiologist;Therapeutic exercise;Therapeutic activities;Taping;Dry needling;Manual techniques;Neuromuscular re-education;Gait training;Stair training;Traction;Moist Heat   PT Next Visit Plan Look at SIJ tests, pelvic stabilization exercises.   PT Home Exercise Plan Supine clamshells.    Consulted and Agree with Plan of Care Patient      Patient will benefit from  skilled therapeutic intervention in order to improve the following deficits and impairments:  Abnormal gait, Pain, Decreased strength, Decreased range of motion, Difficulty walking  Visit Diagnosis: Pain in right hip - Plan: PT plan of care cert/re-cert  Chronic right-sided low back pain without sciatica - Plan: PT plan of care cert/re-cert  Right inguinal pain - Plan: PT plan of care cert/re-cert     Problem List Patient Active Problem List   Diagnosis Date Noted  . Chronic right-sided low back pain without sciatica 12/01/2016  . Right hip pain 04/16/2016  . Cutaneous skin tags 04/16/2016  . Obesity (BMI 30.0-34.9) 04/15/2016   Alva Garnet PT,  DPT, CSCS    12/22/2016, 11:12 AM   [facility] [address] [place], [place], [address] Phone: [phone]   Fax:  [phone]  Name: Peggy Pierce MRN: [id number] Date of Birth: 07/03/1976

## 2016-12-29 ENCOUNTER — Telehealth: Payer: Self-pay | Admitting: *Deleted

## 2016-12-29 ENCOUNTER — Ambulatory Visit: Payer: 59 | Admitting: Physical Therapy

## 2016-12-29 DIAGNOSIS — R1031 Right lower quadrant pain: Secondary | ICD-10-CM

## 2016-12-29 DIAGNOSIS — M545 Low back pain: Secondary | ICD-10-CM

## 2016-12-29 DIAGNOSIS — G8929 Other chronic pain: Secondary | ICD-10-CM

## 2016-12-29 DIAGNOSIS — M25551 Pain in right hip: Secondary | ICD-10-CM

## 2016-12-29 NOTE — Patient Instructions (Signed)
High volt stim to 90V to gluteal tendons, 80V to proximal origin of gluteal musculature.   Hot pack applied to lateral hip musculature.

## 2016-12-29 NOTE — Telephone Encounter (Signed)
[facility] requested that patient been by GYN or he could refer patient a pelvic therapist . Her pain does not align with his treatment. Contact Pat  [phone]

## 2016-12-29 NOTE — Telephone Encounter (Signed)
Have patient follow-up.

## 2016-12-29 NOTE — Therapy (Signed)
[place] [facility] [address], [place], [address] Phone: [phone]   Fax:  [phone]  Physical Therapy Treatment  Patient Details  Name: Peggy Pierce MRN: [id number] Date of Birth: 12-18-1975 No Data Recorded  Encounter Date: 12/29/2016      PT End of Session - 12/29/16 1048    Visit Number 2   Number of Visits 13   Date for PT Re-Evaluation 02/09/17   PT Start Time 1022   PT Stop Time 1100   PT Time Calculation (min) 38 min   Activity Tolerance Patient tolerated treatment well;Patient limited by pain   Behavior During Therapy [facility] for tasks assessed/performed      Past Medical History:  Diagnosis Date  . Allergy   . Asthma   . Chicken pox   . Obesity     Past Surgical History:  Procedure Laterality Date  . BREAST SURGERY    . Pelvic adhesion surgery  2007  . TUBAL LIGATION Bilateral 2007    There were no vitals filed for this visit.      Subjective Assessment - 12/29/16 1042    Subjective Patient reports her menstrual cycle began Friday and she has had progressive increase in R hip, buttock, and groin pain. She reports the exercises were fine, she was able to do them without any added discomfort. She was able to do some walking over the weekend, but reports pain was about her normal.    Limitations Lifting;Walking;House hold activities;Sitting;Standing   Diagnostic tests X-ray per PT appears to have less weightbearing on R side, scoliosis    Patient Stated Goals To be rid of her pain.    Currently in Pain? Yes   Pain Score --  Moderate to severe groin, buttock, lateral hip pain   Pain Location Hip   Pain Orientation Right   Pain Descriptors / Indicators Aching;Throbbing   Pain Type Chronic pain   Pain Onset More than a month ago   Pain Frequency Constant   Aggravating Factors  Menstrual cycle, prolonged sitting/standing/walking    Pain Relieving Factors Hot baths       High volt stim  to 90V to gluteal tendons, 80V to proximal origin of gluteal musculature x 25 minutes (including set up)   Hot pack applied to lateral hip musculature.    ** Patient reports feeling much less pain after completion of modalities, notably decreased limping/antalgic gait pattern.                         PT Education - 12/29/16 1048    Education provided Yes   Education Details Will have patient follow up with pelvic floor PT.    Person(s) Educated Patient   Methods Explanation   Comprehension Verbalized understanding             PT Long Term Goals - 12/22/16 1047      PT LONG TERM GOAL #1   Title Patient will report worst pain of no more than 3/10 to demonstrate improved tolerance for ADLs.    Time 6   Period Weeks   Status New     PT LONG TERM GOAL #2   Title Patient will report no pain with ambulation > 15 minutes to demonstrate improved tolerance for ADLs.    Time 6   Period Weeks   Status New     PT LONG TERM GOAL #3   Title Patient will  report LEFS score of greater than 70/80 to demonstrate improved tolerance for ADLs.    Time 6   Period Weeks   Status New               Plan - 12/29/16 1049    Clinical Impression Statement Patient appears markedly different in this visit, she is limping with obvious antalgic gait. She reports this coincides with her menstrual cycle starting last week. Given this, will call referring provider and discuss OB-GYN follow up and pelvic floor therapist as this appears to be either non-musculoskeletal in origin or from pelvic floor which will be referred to pelvic floor therapist for.    Rehab Potential Good   PT Frequency 2x / week   PT Duration 6 weeks   PT Treatment/Interventions Aquatic Therapy;ADLs/Self Care Home Management;Iontophoresis 4mg /ml Dexamethasone;Cryotherapy;Microbiologistlectrical Stimulation;Balance training;Therapeutic exercise;Therapeutic activities;Taping;Dry needling;Manual techniques;Neuromuscular  re-education;Gait training;Stair training;Traction;Moist Heat   PT Next Visit Plan Look at SIJ tests, pelvic stabilization exercises.   PT Home Exercise Plan Supine clamshells.    Consulted and Agree with Plan of Care Patient      Patient will benefit from skilled therapeutic intervention in order to improve the following deficits and impairments:  Abnormal gait, Pain, Decreased strength, Decreased range of motion, Difficulty walking  Visit Diagnosis: Pain in right hip  Right inguinal pain  Chronic right-sided low back pain without sciatica     Problem List Patient Active Problem List   Diagnosis Date Noted  . Chronic right-sided low back pain without sciatica 12/01/2016  . Right hip pain 04/16/2016  . Cutaneous skin tags 04/16/2016  . Obesity (BMI 30.0-34.9) 04/15/2016   Alva GarnetPatrick Jalen Daluz PT, DPT, CSCS    12/29/2016, 12:38 PM  [place] [facility] [address] [place], [place], [address] Phone: [phone]   Fax:  [phone]  Name: Peggy Pierce MRN: [id number] Date of Birth: 09/01/1975

## 2016-12-29 NOTE — Telephone Encounter (Signed)
Please advise, thanks.

## 2016-12-30 NOTE — Telephone Encounter (Signed)
[facility], he is not in the Office yet today, I will call back to speak with him. thanks

## 2016-12-30 NOTE — Telephone Encounter (Signed)
Spoke with Peggy Pierce at [facility].  He saw the patient twice. This last visit the patient was very different then the prior with regard to her discomfort and pain.  Only change was that she started her menstrual cycle.  She has no injuries to explain the pain she is having.  He did a referral with her to the pelvic specialist that may give more insight.  He thinks a OB/GYN may need to be involved.  I can call patient to follow up with you, what kind of time frame do you want?

## 2017-01-03 ENCOUNTER — Ambulatory Visit: Payer: 59 | Attending: Family Medicine | Admitting: Physical Therapy

## 2017-01-03 ENCOUNTER — Ambulatory Visit: Payer: 59 | Admitting: Physical Therapy

## 2017-01-03 DIAGNOSIS — M545 Low back pain, unspecified: Secondary | ICD-10-CM

## 2017-01-03 DIAGNOSIS — M25551 Pain in right hip: Secondary | ICD-10-CM | POA: Insufficient documentation

## 2017-01-03 DIAGNOSIS — G8929 Other chronic pain: Secondary | ICD-10-CM | POA: Diagnosis present

## 2017-01-03 DIAGNOSIS — R1031 Right lower quadrant pain: Secondary | ICD-10-CM | POA: Insufficient documentation

## 2017-01-03 NOTE — Patient Instructions (Addendum)
  Clam Shell 45 Degrees   Lying with hips and knees bent 45, one pillow between knees and ankles. Lift knee with exhale. Be sure pelvis does not roll backward. Do not arch back. Do 10 times, each leg, 2 times per day.  http://ss.exer.us/75   Copyright  VHI. All rights reserved.  _______  Complimentary stretch:  Stretches for pelvic floor: Laying on your back with a towel under opposite thigh of the thigh you want to stretch Cross __  R thigh over _L _  Thigh  Inhale, feel pelvic floor lengthen/diaphragm expand left and right laterally Exhale, bring thighs closer to chest by pulling towel with hands. Keep shoulders and neck down on the pillow and relaxed.  10 in bvetween clams    ________   Work:   3-4 x /day   Stretches every hour  standing L side stretch Hips and knees stay stable  10 reps         Modified side plank at the desk:  Leaning on L edge of the foot, knees unlocked, Body in a diagonal to strengthen the L lower trunk side muscle  5 sec holds  X 5       Standing crossed R  thigh stretch  5 rep       Getting of chair:  Proper body mechanics with getting out of a chair to decrease strain  on back &pelvic floor   Avoid holding your breath when Getting out of the chair:  Scoot to front part of chair chair Heels behind feet, feet are hip width apart, nose over toes  Inhale like you are smelling roses Exhale to stand       _____  Shoe lift in the R shoe

## 2017-01-03 NOTE — Therapy (Addendum)
[facility] [address] [place], [place], [address] Phone: [phone]   Fax:  [phone]  Physical Therapy Treatment  Patient Details  Name: Peggy DadaFawn A Stucke MRN: [id number] Date of Birth: 06/06/1976 No Data Recorded  Encounter Date: 01/03/2017      PT End of Session - 01/03/17 0953    Visit Number 3   Number of Visits 13   Date for PT Re-Evaluation 02/09/17   PT Start Time 0910   PT Stop Time 1000   PT Time Calculation (min) 50 min   Activity Tolerance Patient tolerated treatment well;Patient limited by pain   Behavior [facility] Therapy [facility] for tasks assessed/performed      Past Medical History:  Diagnosis Date  . Allergy   . Asthma   . Chicken pox   . Obesity     Past Surgical History:  Procedure Laterality Date  . BREAST SURGERY    . Pelvic adhesion surgery  2007  . TUBAL LIGATION Bilateral 2007    There were no vitals filed for this visit.      Subjective Assessment - 01/03/17 0913    Subjective R hip pain currently is 4/10 "muscle pain in the R buttock and hip " . No radiating pain.  Pain is constant. One week before her period pain  is minimal.  Pt finished her period 5/28 and her pain during her cycle was 2/10. After her period for a few days pain 6-7/10. She stays in her bathtub if she can. For 3 weeks after her cycle,  pain remain 4/10. Job :  sitting for 7 hours. Pt currently has L foot pain behind the arch to compensate for her R hip.  Pain occurs with sit to stand, standing for > 15 min, sitting > 30 min, climbing stairs ( stoop 7 steps with hand rail at home),     Pertinent History Pt reported she underwent pelvic adhesion because her R ovary "fell" and her OB/GYN MD fixed the adhesions of her ovary to the back of her uterus 2006. Tubal ligation in 2007.  10 years ago, pt had pelvic pain during and after her 2 nd pregnancy. Pt experienced a "popping out of her pelvic muscles" sensation for 3-4 months after  her pregnancy. It resolved on its own with heat applications. Two vaginal deliveries.    Limitations Lifting;Walking;House hold activities;Sitting;Standing   Diagnostic tests X-ray per PT appears to have less weightbearing on R side, scoliosis    Patient Stated Goals To be rid of her pain.    Pain Onset More than a month ago            [facility] PT Assessment - 01/03/17 0920      Observation/Other Assessments   Observations L iliac crest higher than R in stance, R malleoli lower than L in supine  hyperextended knees      Coordination   Gross Motor Movements are Fluid and Coordinated --  plan to assess deep core coordination, strength     Sit to Stand   Comments breattholding and grunting      Palpation   SI assessment  LLE diferrence ASIS to medial malleoli: LLE 86 cm, R 83 cm).  Tenderness along R medial border of sacrum, Limited mobility of iliac crest over sacrum     Palpation comment increased abdominal scar (hypertrophic) below umbilicus) .      Ambulation/Gait   Gait Pattern --  decreased R stance phase  Gait velocity 1.17 m/s (pre Tx) 1.49 m/s ( after shoe lift inserted in RLE and Tx)                    Pelvic Floor Special Questions - 01/03/17 1000    Diastasis Recti 2 fingers width above umbilicus            [facility] Adult PT Treatment/Exercise - 01/03/17 0920      Therapeutic Activites    Therapeutic Activities --  see pt isntructions     Neuro Re-ed    Neuro Re-ed Details  see pt instructions     Manual Therapy   Manual therapy comments long axis distraction, L sidelying.  inferior mob of sacrum, PA mob along R lateral border of SIJ, rotational mob (hip flex, ext)       Moist Heat 5 min on R hip: no billed.           PT Education - 01/03/17 1000    Education provided Yes   Education Details HEP, shoe lift, and POC from pelvic floor PT approach    Person(s) Educated Patient   Methods Explanation;Demonstration;Tactile cues;Verbal  cues;Handout   Comprehension Verbal cues required;Returned demonstration;Verbalized understanding;Tactile cues required             PT Long Term Goals - 01/03/17 4098      PT LONG TERM GOAL #1   Title Patient will report worst pain of no more than 3/10 to demonstrate improved tolerance for ADLs.    Time 6   Period Weeks   Status Revised     PT LONG TERM GOAL #2   Title Patient will demo increased gait speed from 1.12 m/s to > 1.2 m/s and pain level decrease 4/10 to < 2/10 in order to walk for fitness    Time 6   Period Weeks   Status New     PT LONG TERM GOAL #3   Title Patient will report LEFS score of greater than 70/80 to demonstrate improved tolerance for ADLs.    Time 6   Period Weeks   Status New               Plan - 01/03/17 1001    Clinical Impression Statement Pt reports her R hip pain decreased from 4/10 to 2/10 and noticed mostly soreness in her R hip. Heat helped the soreness to resolve.  Pt tolerated manual Tx which helped to increase SIJ mobility on R hip and she demo'd less tenderness and tensions post Tx along the lateral border of her SIJ on R side. Pt also showed increased gait speed and less deviations after Tx and with the shoe lift donned in her R shoe. HEP was applied to address her L lumbar scoliotic spine which is one contributing factor to her R hip pain. Plan to address poor intraabdominal pressure system for postural stability (poor deep core strength, diastasis recti, and abdominal scar restrictions) in order to address her menstrual cycle related Sx.  Pt continues to benefit from skilled PT.     Rehab Potential Good   PT Frequency 2x / week   PT Duration 6 weeks   PT Treatment/Interventions Aquatic Therapy;ADLs/Self Care Home Management;Iontophoresis 4mg /ml Dexamethasone;Cryotherapy;Microbiologist;Therapeutic exercise;Therapeutic activities;Taping;Dry needling;Manual techniques;Neuromuscular re-education;Gait  training;Stair training;Traction;Moist Heat   PT Next Visit Plan Look at SIJ tests, pelvic stabilization exercises.   PT Home Exercise Plan Supine clamshells.    Consulted and Agree with Plan of Care Patient  Patient will benefit from skilled therapeutic intervention in order to improve the following deficits and impairments:  Abnormal gait, Pain, Decreased strength, Decreased range of motion, Difficulty walking  Visit Diagnosis: Pain in right hip  Right inguinal pain  Chronic right-sided low back pain without sciatica     Problem List Patient Active Problem List   Diagnosis Date Noted  . Chronic right-sided low back pain without sciatica 12/01/2016  . Right hip pain 04/16/2016  . Cutaneous skin tags 04/16/2016  . Obesity (BMI 30.0-34.9) 04/15/2016    Mariane Masters ,PT, DPT, E-RYT  01/03/2017, 10:02 AM  [facility] MAIN [facility] SERVICES [address] [place], [place], [address] Phone: [phone]   Fax:  [phone]  Name: WAUNETA SILVERIA MRN: [id number] Date of Birth: [birth year]

## 2017-01-05 ENCOUNTER — Encounter: Payer: 59 | Admitting: Physical Therapy

## 2017-01-10 ENCOUNTER — Encounter: Payer: 59 | Admitting: Physical Therapy

## 2017-01-12 ENCOUNTER — Encounter: Payer: 59 | Admitting: Physical Therapy

## 2017-01-13 ENCOUNTER — Ambulatory Visit: Payer: 59 | Admitting: Physical Therapy

## 2017-01-13 DIAGNOSIS — M25551 Pain in right hip: Secondary | ICD-10-CM | POA: Diagnosis not present

## 2017-01-13 DIAGNOSIS — R1031 Right lower quadrant pain: Secondary | ICD-10-CM

## 2017-01-13 DIAGNOSIS — M545 Low back pain: Secondary | ICD-10-CM

## 2017-01-13 DIAGNOSIS — G8929 Other chronic pain: Secondary | ICD-10-CM

## 2017-01-13 NOTE — Therapy (Signed)
[place] [facility] MAIN [facility] SERVICES [address] [place], [place], [address] Phone: [phone]   Fax:  [phone]  Physical Therapy Treatment  Patient Details  Name: Peggy Pierce MRN: [id number] Date of Birth: 1975-12-14 No Data Recorded  Encounter Date: 01/13/2017      PT End of Session - 01/13/17 1615    Visit Number 4   Number of Visits 13   Date for PT Re-Evaluation 02/09/17   PT Start Time 1530   PT Stop Time 1615   PT Time Calculation (min) 45 min   Activity Tolerance Patient tolerated treatment well;Patient limited by pain   Behavior During Therapy [facility] for tasks assessed/performed      Past Medical History:  Diagnosis Date  . Allergy   . Asthma   . Chicken pox   . Obesity     Past Surgical History:  Procedure Laterality Date  . BREAST SURGERY    . Pelvic adhesion surgery  2007  . TUBAL LIGATION Bilateral 2007    There were no vitals filed for this visit.      Subjective Assessment - 01/13/17 1535    Subjective Pt felt sore after last session and then she felt "foot loose and fancy free" since then. The shoe lift has made a big difference to her walking. No more LBP except for minor twinges.  Pt is due for her menstrual cycle in 2 weeks.             [facility] PT Assessment - 01/13/17 1536      Posture/Postural Control   Posture Comments lumbopelvic perturbations with level 2 deep core mm     Palpation   SI assessment  no tenderness R medial border of sacrum, increased mobility of SIJ    Palpation comment decreased abdominal scar restrictions post Tx,                     [facility] Adult PT Treatment/Exercise - 01/13/17 1536      Therapeutic Activites    Therapeutic Activities --  see pt isntructions     Moist Heat Therapy -- NO CHARGE   Number Minutes Moist Heat 10 Minutes   Moist Heat Location Other (comment)  abdomen     Manual Therapy   Manual therapy comments fascial/ scar releases over scar at  umbilicus                 PT Education - 01/13/17 1615    Education provided Yes   Education Details HEP   Person(s) Educated Patient   Methods Explanation;Demonstration;Tactile cues;Verbal cues;Handout   Comprehension Returned demonstration;Verbalized understanding             PT Long Term Goals - 01/03/17 6962      PT LONG TERM GOAL #1   Title Patient will report worst pain of no more than 3/10 to demonstrate improved tolerance for ADLs.    Time 6   Period Weeks   Status Revised     PT LONG TERM GOAL #2   Title Patient will demo increased gait speed from 1.12 m/s to > 1.2 m/s and pain level decrease 4/10 to < 2/10 in order to walk for fitness    Time 6   Period Weeks   Status achieved     PT LONG TERM GOAL #3   Title Patient will report LEFS score of greater than 70/80 to demonstrate improved tolerance for ADLs.    Time 6  Period Weeks   Status On going                Plan - 01/13/17 1616    Clinical Impression Statement Pt demo'd no pelvic obliquities and has maintained proper mobility in SIJ since last session. Pt reports no pain since last session and that the shoe lift has made a big difference in her walking. Addressed abdominal scar restrictionsand initiated abdominal and scar masasge today along with deep core strengthening. Anticipate pt will continue progress towards her goals with skilled pelvic health PT.  Plan to address HEP for scoliosis at next session and later pelvic assessment.    Rehab Potential Good   PT Frequency 2x / week   PT Duration 6 weeks   PT Treatment/Interventions Aquatic Therapy;ADLs/Self Care Home Management;Iontophoresis 4mg /ml Dexamethasone;Cryotherapy;Microbiologistlectrical Stimulation;Balance training;Therapeutic exercise;Therapeutic activities;Taping;Dry needling;Manual techniques;Neuromuscular re-education;Gait training;Stair training;Traction;Moist Heat   PT Next Visit Plan Look at SIJ tests, pelvic stabilization exercises.    PT Home Exercise Plan Supine clamshells.    Consulted and Agree with Plan of Care Patient      Patient will benefit from skilled therapeutic intervention in order to improve the following deficits and impairments:  Abnormal gait, Pain, Decreased strength, Decreased range of motion, Difficulty walking  Visit Diagnosis: Pain in right hip  Right inguinal pain  Chronic right-sided low back pain without sciatica     Problem List Patient Active Problem List   Diagnosis Date Noted  . Chronic right-sided low back pain without sciatica 12/01/2016  . Right hip pain 04/16/2016  . Cutaneous skin tags 04/16/2016  . Obesity (BMI 30.0-34.9) 04/15/2016    Mariane MastersYeung,Shin Yiing ,PT, DPT, E-RYT  01/13/2017, 4:21 PM  [facility] MAIN [facility] [address] [place], [place], [address] Phone: [phone]   Fax:  [phone]  Name: Peggy Pierce MRN: [id number] Date of Birth: 05/12/1976

## 2017-01-13 NOTE — Patient Instructions (Signed)
Abdominal massage (handout)  Deep core level 2 ( included in handout)  5 min gentle scar massage zig zag over scar with olive/ coconut oil

## 2017-02-03 ENCOUNTER — Ambulatory Visit: Payer: 59 | Attending: Family Medicine | Admitting: Physical Therapy

## 2017-02-03 DIAGNOSIS — R1031 Right lower quadrant pain: Secondary | ICD-10-CM | POA: Insufficient documentation

## 2017-02-03 DIAGNOSIS — M545 Low back pain: Secondary | ICD-10-CM | POA: Insufficient documentation

## 2017-02-03 DIAGNOSIS — M25551 Pain in right hip: Secondary | ICD-10-CM | POA: Insufficient documentation

## 2017-02-03 DIAGNOSIS — G8929 Other chronic pain: Secondary | ICD-10-CM | POA: Insufficient documentation

## 2017-02-17 ENCOUNTER — Ambulatory Visit: Payer: 59 | Admitting: Physical Therapy

## 2017-02-17 DIAGNOSIS — M25551 Pain in right hip: Secondary | ICD-10-CM | POA: Diagnosis not present

## 2017-02-17 DIAGNOSIS — G8929 Other chronic pain: Secondary | ICD-10-CM

## 2017-02-17 DIAGNOSIS — M545 Low back pain, unspecified: Secondary | ICD-10-CM

## 2017-02-17 DIAGNOSIS — R1031 Right lower quadrant pain: Secondary | ICD-10-CM

## 2017-02-17 NOTE — Addendum Note (Signed)
Addended by: Mariane MastersYEUNG, SHIN-YIING on: 02/17/2017 04:41 PM   Modules accepted: Orders

## 2017-02-17 NOTE — Therapy (Addendum)
[place] MAIN [facility] SERVICES [address] [place], [place], [address] Phone: [phone]   Fax:  [phone]  Physical Therapy Treatment / Discharge Note   Patient Details  Name: Peggy Pierce MRN: [id number] Date of Birth: [birth year] No Data Recorded  Encounter Date: 02/17/2017      PT End of Session - 02/17/17 1556    Visit Number 5   Number of Visits 13   Date for PT Re-Evaluation 02/09/17   PT Start Time 1540   PT Stop Time 1555   PT Time Calculation (min) 15 min   Activity Tolerance Patient tolerated treatment well;Patient limited by pain   Behavior During Therapy [facility] for tasks assessed/performed      Past Medical History:  Diagnosis Date  . Allergy   . Asthma   . Chicken pox   . Obesity     Past Surgical History:  Procedure Laterality Date  . BREAST SURGERY    . Pelvic adhesion surgery  2007  . TUBAL LIGATION Bilateral 2007    There were no vitals filed for this visit.      Subjective Assessment - 02/17/17 1545    Subjective Pt reports she has not had back pain. But when she was on her vacation and did not have her shoe lift, she felt her back pain.  Pt has less menstrual pain in the back, groin, hip which allowed her to drive her family to [place] with an "annoying" level of pain instead of bedridden.  Pt 's husband noticed pt was in less pain than in previous months when she was on her menstrual cycle. Pt's mom and aunt her walking was better.                          [place] Adult PT Treatment/Exercise - 02/17/17 1607      Therapeutic Activites    Therapeutic Activities --  see pt instructions                 PT Education - 02/17/17 1609    Education provided Yes   Education Details resources and self-management d/c    Person(s) Educated Patient   Methods Explanation   Comprehension Verbalized understanding             PT Long Term Goals - 02/17/17 1547      PT LONG TERM GOAL #1    Title Patient will report worst pain of no more than 3/10 to demonstrate improved tolerance for ADLs.    Time 6   Period Weeks   Status Achieved     PT LONG TERM GOAL #2   Title Patient will demo increased gait speed from 1.12 m/s to > 1.2 m/s and pain level decrease 4/10 to < 2/10 in order to walk for fitness    Time 6   Period Weeks   Status Achieved     PT LONG TERM GOAL #3   Title Patient will report LEFS score of greater than 70/80 to demonstrate improved tolerance for ADLs.  (7/19: 71/80)   Baseline 57/80   Time 6   Period Weeks   Status Achieved     PT LONG TERM GOAL #4   Title Pt will report no R buttock, groin, and lower back pain associated with menstrual cycle  in order to improve QOL ( 2/10 pain instead of 5/10) Not debiliating in bed 2 days (month)  and was able to  participate in family events.    Time 6   Period Weeks   Status Partially Met               Plan - 02/17/17 1600    Clinical Impression Statement Pt reports her R back, groin, hip pain have improved "A Great Deal Better" according to the [facility] scale. Pt 's gait and LEFS score improved (57/80  to 71/80)  pts as her leg length difference and scoliosis deficits were addressed. Pt continues to show no pelvic obliquities with use of shoe lift insert. Pt's R hip, back, buttock pain improved also during her menstrual cycle which enabled her to drive her family on an out of state trip when typically she is bedridden for 2 days. Pt's family members have relayed to her that they have noticed a difference in her walking and her being in less pain. Pt remains IND with abdominal scar massage to minimize scar restrictions which were likely associated with her menstrual related pain.   Pt has achieved 3/4 goals and was very close to meeting her remaining goal. Pt has been educated on continuation of self-management w/ the name of a yoga DVD for scoliosis,  resources for getting permanent shoe lifts on shoes, and name of a  company to buy additional shoe lift inserts. Pt was provided another shoe lift for her tennis shoe at no charge.  Pt is ready for d/c at this time.     Rehab Potential Good   PT Frequency 2x / week   PT Duration 6 weeks   PT Treatment/Interventions Aquatic Therapy;ADLs/Self Care Home Management;Iontophoresis 82m/ml Dexamethasone;Cryotherapy;ELobbyistTherapeutic exercise;Therapeutic activities;Taping;Dry needling;Manual techniques;Neuromuscular re-education;Gait training;Stair training;Traction;Moist Heat   PT Next Visit Plan Look at SIJ tests, pelvic stabilization exercises.   PT Home Exercise Plan Supine clamshells.    Consulted and Agree with Plan of Care Patient      Patient will benefit from skilled therapeutic intervention in order to improve the following deficits and impairments:  Abnormal gait, Pain, Decreased strength, Decreased range of motion, Difficulty walking  Visit Diagnosis: Pain in right hip  Right inguinal pain  Chronic right-sided low back pain without sciatica     Problem List Patient Active Problem List   Diagnosis Date Noted  . Chronic right-sided low back pain without sciatica 12/01/2016  . Right hip pain 04/16/2016  . Cutaneous skin tags 04/16/2016  . Obesity (BMI 30.0-34.9) 04/15/2016    YJerl Mina ,PT, DPT, E-RYT  02/17/2017, 4:26 PM  [facility] [address] [address]Phone: [phone]  Fax:  [phone] Name: Peggy STROTHERMRN: [id number] of Birth: 4March 15, 1977

## 2017-02-17 NOTE — Patient Instructions (Signed)
Provided patient another shoe lift for tennis shoe ( create a template with paper before cutting to not over cut edges   [employer] are companies which can create permanent shoe lifts externally on shoe sole ( out of pocket fees)   [email or web address] DVD for yoga

## 2017-03-03 ENCOUNTER — Encounter: Payer: 59 | Admitting: Physical Therapy

## 2017-09-01 ENCOUNTER — Telehealth: Payer: Self-pay

## 2017-09-01 NOTE — Telephone Encounter (Signed)
Patient scheduled.

## 2017-09-01 NOTE — Telephone Encounter (Signed)
Ok to double book at 4:15   [employer]

## 2017-09-01 NOTE — Telephone Encounter (Signed)
Copied from CRM [id number]. Topic: Appointment Scheduling - Scheduling Inquiry for Clinic >> Sep 01, 2017  1:32 PM Diana EvesHoyt, Maryann B wrote: Reason for CRM: [place] and she has an appt set in March to transfer. However she is having some on  going hip pain. I have scheduled her for 09/07/17 with Dr. Shirlee [place]atchMclean, her husband is coming in tomorrow and she wants to know if she can be worked in around the time he is. His appt is at 4:00 with Dr. Birdie SonsSonnenberg.

## 2017-09-01 NOTE — Telephone Encounter (Signed)
Spoke with patient she hasn't went to ER or urgent care. Patient advised will be here at 415 tomorrow

## 2017-09-01 NOTE — Telephone Encounter (Addendum)
Called patient back  hip pain was chronic and recently flared up now .  In speaking with patient she is a lot of pain.  She can't get comfortable siting standing . No appointment available here tomorrow.   Advised her to go to urgent care and get evaluated or ER.

## 2017-09-02 ENCOUNTER — Ambulatory Visit: Payer: Managed Care, Other (non HMO)

## 2017-09-02 ENCOUNTER — Encounter: Payer: Self-pay | Admitting: Internal Medicine

## 2017-09-02 ENCOUNTER — Ambulatory Visit: Payer: Managed Care, Other (non HMO) | Admitting: Internal Medicine

## 2017-09-02 ENCOUNTER — Ambulatory Visit (INDEPENDENT_AMBULATORY_CARE_PROVIDER_SITE_OTHER): Payer: Managed Care, Other (non HMO)

## 2017-09-02 VITALS — BP 104/78 | HR 104 | Temp 98.1°F | Ht 63.0 in | Wt 179.8 lb

## 2017-09-02 DIAGNOSIS — N83209 Unspecified ovarian cyst, unspecified side: Secondary | ICD-10-CM | POA: Diagnosis not present

## 2017-09-02 DIAGNOSIS — M25551 Pain in right hip: Secondary | ICD-10-CM

## 2017-09-02 DIAGNOSIS — K5900 Constipation, unspecified: Secondary | ICD-10-CM | POA: Diagnosis not present

## 2017-09-02 DIAGNOSIS — M533 Sacrococcygeal disorders, not elsewhere classified: Secondary | ICD-10-CM

## 2017-09-02 DIAGNOSIS — R102 Pelvic and perineal pain: Secondary | ICD-10-CM

## 2017-09-02 MED ORDER — TRAMADOL HCL 50 MG PO TABS
50.0000 mg | ORAL_TABLET | Freq: Three times a day (TID) | ORAL | 0 refills | Status: DC | PRN
Start: 2017-09-02 — End: 2017-09-02

## 2017-09-02 MED ORDER — KETOROLAC TROMETHAMINE 60 MG/2ML IM SOLN
60.0000 mg | Freq: Once | INTRAMUSCULAR | Status: AC
  Administered 2017-09-02: 60 mg via INTRAMUSCULAR

## 2017-09-02 MED ORDER — TRAMADOL HCL 50 MG PO TABS: 50.0000 mg | ORAL_TABLET | Freq: Three times a day (TID) | ORAL | 0 refills | Status: DC | PRN

## 2017-09-02 NOTE — Patient Instructions (Signed)
Donut cushion  Try Miralax for constipation symptoms and to help stool come out easily  We gave you toradol today  I referred you to Dr. Bonney AidStaebler  Please try Tramadol as needed for pain  F/u in 1-2 weeks sooner if needed    Constipation, Adult Constipation is when a person has fewer bowel movements in a week than normal, has difficulty having a bowel movement, or has stools that are dry, hard, or larger than normal. Constipation may be caused by an underlying condition. It may become worse with age if a person takes certain medicines and does not take in enough fluids. Follow these instructions at home: Eating and drinking   Eat foods that have a lot of fiber, such as fresh fruits and vegetables, whole grains, and beans.  Limit foods that are high in fat, low in fiber, or overly processed, such as french fries, hamburgers, cookies, candies, and soda.  Drink enough fluid to keep your urine clear or pale yellow. General instructions  Exercise regularly or as told by your health care provider.  Go to the restroom when you have the urge to go. Do not hold it in.  Take over-the-counter and prescription medicines only as told by your health care provider. These include any fiber supplements.  Practice pelvic floor retraining exercises, such as deep breathing while relaxing the lower abdomen and pelvic floor relaxation during bowel movements.  Watch your condition for any changes.  Keep all follow-up visits as told by your health care provider. This is important. Contact a health care provider if:  You have pain that gets worse.  You have a fever.  You do not have a bowel movement after 4 days.  You vomit.  You are not hungry.  You lose weight.  You are bleeding from the anus.  You have thin, pencil-like stools. Get help right away if:  You have a fever and your symptoms suddenly get worse.  You leak stool or have blood in your stool.  Your abdomen is bloated.  You  have severe pain in your abdomen.  You feel dizzy or you faint. This information is not intended to replace advice given to you by your health care provider. Make sure you discuss any questions you have with your health care provider. Document Released: 04/16/2004 Document Revised: 02/06/2016 Document Reviewed: 01/07/2016 Elsevier Interactive Patient Education  2018 [employer]

## 2017-09-02 NOTE — Progress Notes (Signed)
Pre visit review using our clinic review tool, if applicable. No additional management support is needed unless otherwise documented below in the visit note. 

## 2017-09-02 NOTE — Progress Notes (Signed)
I saw the patient in conjunction with Dr. Judie GrieveMcLean-Scocuzza today.  Dr. McLean-Scocuzza's DEA number had expired and she was unable to prescribe the appropriate medication for the patient.  Patient is having pain in her coccyx for some time now.  She was tender on exam over her right coccyx with no overlying skin changes or palpable abnormalities.  She reports her discomfort did improve some with Toradol though does not appear to have been improving with ibuprofen.  She notes she does not take any other medications.  No history of seizures.  Tramadol would be an appropriate treatment given her level of discomfort.  I prescribed this given the Dr. Judie GrieveMclean-Scocuzza was unable to do that with her DEA having expired.  Peggy AlarEric Arelys Glassco, MD

## 2017-09-04 ENCOUNTER — Emergency Department: Payer: Managed Care, Other (non HMO)

## 2017-09-04 ENCOUNTER — Other Ambulatory Visit: Payer: Self-pay

## 2017-09-04 ENCOUNTER — Emergency Department
Admission: EM | Admit: 2017-09-04 | Discharge: 2017-09-04 | Disposition: A | Discharge status: Home / Self Care | Payer: Managed Care, Other (non HMO) | Attending: Emergency Medicine | Admitting: Emergency Medicine

## 2017-09-04 DIAGNOSIS — J45909 Unspecified asthma, uncomplicated: Secondary | ICD-10-CM | POA: Insufficient documentation

## 2017-09-04 DIAGNOSIS — R102 Pelvic and perineal pain: Secondary | ICD-10-CM | POA: Diagnosis present

## 2017-09-04 DIAGNOSIS — F17210 Nicotine dependence, cigarettes, uncomplicated: Secondary | ICD-10-CM | POA: Insufficient documentation

## 2017-09-04 LAB — URINALYSIS, ROUTINE W REFLEX MICROSCOPIC
BACTERIA UA: NONE SEEN
Bilirubin Urine: NEGATIVE
Glucose, UA: NEGATIVE mg/dL
HGB URINE DIPSTICK: NEGATIVE
Ketones, ur: NEGATIVE mg/dL
NITRITE: NEGATIVE
Protein, ur: NEGATIVE mg/dL
SPECIFIC GRAVITY, URINE: 1.011 (ref 1.005–1.030)
pH: 5 (ref 5.0–8.0)

## 2017-09-04 LAB — BASIC METABOLIC PANEL
Anion gap: 7 (ref 5–15)
BUN: 6 mg/dL (ref 6–20)
CO2: 23 mmol/L (ref 22–32)
Calcium: 9 mg/dL (ref 8.9–10.3)
Chloride: 105 mmol/L (ref 101–111)
Creatinine, Ser: 0.8 mg/dL (ref 0.44–1.00)
GFR calc Af Amer: >60 mL/min (ref >60)
GFR calc non Af Amer: >60 mL/min (ref >60)
Glucose, Bld: 114 mg/dL — ABNORMAL HIGH (ref 65–99)
Potassium: 3.7 mmol/L (ref 3.5–5.1)
Sodium: 135 mmol/L (ref 135–145)

## 2017-09-04 LAB — CBC WITH DIFFERENTIAL/PLATELET
Basophils Absolute: 0 10*3/uL (ref 0–0.1)
Basophils Relative: 0 %
Eosinophils Absolute: 0.1 10*3/uL (ref 0–0.7)
Eosinophils Relative: 1 %
HCT: 40.9 % (ref 35.0–47.0)
Hemoglobin: 14.2 g/dL (ref 12.0–16.0)
Lymphocytes Relative: 30 %
Lymphs Abs: 2.3 10*3/uL (ref 1.0–3.6)
MCH: 30.6 pg (ref 26.0–34.0)
MCHC: 34.8 g/dL (ref 32.0–36.0)
MCV: 88 fL (ref 80.0–100.0)
Monocytes Absolute: 0.3 10*3/uL (ref 0.2–0.9)
Monocytes Relative: 4 %
Neutro Abs: 4.9 10*3/uL (ref 1.4–6.5)
Neutrophils Relative %: 65 %
Platelets: 250 10*3/uL (ref 150–440)
RBC: 4.64 MIL/uL (ref 3.80–5.20)
RDW: 13.6 % (ref 11.5–14.5)
WBC: 7.7 10*3/uL (ref 3.6–11.0)

## 2017-09-04 LAB — PREGNANCY, URINE: PREG TEST UR: NEGATIVE

## 2017-09-04 LAB — HCG, QUANTITATIVE, PREGNANCY

## 2017-09-04 MED ORDER — HYDROMORPHONE HCL 1 MG/ML IJ SOLN: 0.5000 mg | INTRAMUSCULAR | Status: DC

## 2017-09-04 MED ORDER — HYDROMORPHONE HCL 1 MG/ML IJ SOLN
0.5000 mg | INTRAMUSCULAR | Status: AC
  Administered 2017-09-04: 0.5 mg via INTRAVENOUS
  Filled 2017-09-04: qty 1

## 2017-09-04 MED ORDER — IOPAMIDOL (ISOVUE-300) INJECTION 61%
100.0000 mL | Freq: Once | INTRAVENOUS | Status: AC | PRN
  Administered 2017-09-04: 100 mL via INTRAVENOUS

## 2017-09-04 MED ORDER — KETOROLAC TROMETHAMINE 30 MG/ML IJ SOLN
30.0000 mg | Freq: Once | INTRAMUSCULAR | Status: AC
  Administered 2017-09-04: 30 mg via INTRAVENOUS
  Filled 2017-09-04: qty 1

## 2017-09-04 MED ORDER — OXYCODONE HCL 5 MG PO TABS: 5.0000 mg | ORAL_TABLET | Freq: Four times a day (QID) | ORAL | 0 refills | Status: DC | PRN

## 2017-09-04 MED ORDER — ONDANSETRON 4 MG PO TBDP: 4.0000 mg | ORAL_TABLET | Freq: Four times a day (QID) | ORAL | 0 refills | Status: DC | PRN

## 2017-09-04 MED ORDER — OXYCODONE-ACETAMINOPHEN 5-325 MG PO TABS
1.0000 | ORAL_TABLET | ORAL | Status: AC
  Administered 2017-09-04: 1 via ORAL
  Filled 2017-09-04: qty 1

## 2017-09-04 NOTE — ED Notes (Signed)
Pt returned from ultrasound and continues to feel better, she was able to void while there which has relieved some pressure

## 2017-09-04 NOTE — ED Triage Notes (Signed)
Patient reports unable to urinate.  Reports last time able to fully empty bladder was Saturday morning.  Reports no previous history of same.

## 2017-09-04 NOTE — ED Provider Notes (Signed)
[facility] Emergency Department Provider Note ____________________________________________   First MD Initiated Contact with Patient 09/04/17 0732     (approximate)  I have reviewed the triage vital signs and the nursing notes.   HISTORY  Chief Complaint Urinary Retention   HPI Peggy Pierce is a 42 y.o. female reports that she has been having increasing pain and difficulty with urination since Saturday.  She reports that her menstrual cycle started just a couple of days ago, and for the last year whenever she has her menstrual cycle she develops pain across her lower abdomen and seems to radiate slightly toward her left lower back.  The pain becomes so great at times that she feels like she has difficulty urinating, but she reports that seem to be getting worse with every menstrual cycle and today the pain became the worst she is experience.  He did goes along with nausea but no vomiting.  No loose stools.  She seen her primary care doctor for this, reports she had an x-ray recently and that they would like for her to follow-up with gynecology gave her a prescription for tramadol.  The pain does radiate somewhat towards the left lower pelvic region.  It is relatively constant.  Denies pregnancy.  Denies vaginal discharge other than some spotting which she says is consistent with her normal menses.  Past Medical History:  Diagnosis Date  . Allergy   . Asthma   . Chicken pox   . Obesity     Patient Active Problem List   Diagnosis Date Noted  . Chronic right-sided low back pain without sciatica 12/01/2016  . Right hip pain 04/16/2016  . Cutaneous skin tags 04/16/2016  . Obesity (BMI 30.0-34.9) 04/15/2016    Past Surgical History:  Procedure Laterality Date  . BREAST SURGERY    . Pelvic adhesion surgery  2007  . TUBAL LIGATION Bilateral 2007    Prior to Admission medications   Medication Sig Start Date End Date Taking? Authorizing Provider  ibuprofen  (ADVIL,MOTRIN) 800 MG tablet Take 800 mg by mouth every 8 (eight) hours as needed.    [provider]  ondansetron (ZOFRAN ODT) 4 MG disintegrating tablet Take 1 tablet (4 mg total) by mouth every 6 (six) hours as needed for nausea or vomiting. 09/04/17   Sharyn CreamerQuale, Juna Caban, MD  oxyCODONE (OXY IR/ROXICODONE) 5 MG immediate release tablet Take 1 tablet (5 mg total) by mouth every 6 (six) hours as needed for severe pain. 09/04/17   Sharyn CreamerQuale, Kindel Rochefort, MD    Allergies Patient has no known allergies.  Family History  Problem Relation Age of Onset  . Hypertension Mother   . Cancer Mother 2743       one breast/tn  . Stroke Mother   . Diabetes Father   . Hypertension Father   . Cancer Father [id number]       liver cancer  . Lung cancer Father   . Hyperlipidemia Father   . Stroke Father   . Asthma Daughter   . Cancer Maternal Grandmother        lung cancer  . Cancer Maternal Grandfather        lung cancer  . Diabetes Paternal Grandfather   . Hypertension Paternal Grandfather     Social History Social History   Tobacco Use  . Smoking status: Light Tobacco Smoker    Packs/day: 0.25    Years: 10.00    Pack years: 2.50    Types: Cigarettes  . Smokeless  tobacco: Never Used  Substance Use Topics  . Alcohol use: Yes    Comment: rare  . Drug use: No    Review of Systems Constitutional: No fever/chills Eyes: No visual changes. ENT: No sore throat. Cardiovascular: Denies chest pain. Respiratory: Denies shortness of breath. Gastrointestinal: No vomiting.  No diarrhea.  No constipation. Genitourinary: Negative for dysuria. Musculoskeletal: Negative for back pain. Skin: Negative for rash. Neurological: Negative for headaches, focal weakness or numbness.    ____________________________________________   PHYSICAL EXAM:  VITAL SIGNS: ED Triage Vitals  Enc Vitals Group     BP 09/04/17 0216 126/62     Pulse Rate 09/04/17 0216 (!) 54     Resp 09/04/17 0216 (!) 26     Temp 09/04/17 0216 98.7  F (37.1 C)     Temp Source 09/04/17 0216 Oral     SpO2 09/04/17 0216 97 %     Weight 09/04/17 0437 179 lb (81.2 kg)     Height 09/04/17 0437 5\' 2"  (1.575 m)     Head Circumference --      Peak Flow --      Pain Score 09/04/17 0835 0     Pain Loc --      Pain Edu? --      Excl. in GC? --     Constitutional: Alert and oriented. Well appearing and in no acute distress. Eyes: Conjunctivae are normal. Head: Atraumatic. Nose: No congestion/rhinnorhea. Mouth/Throat: Mucous membranes are moist. Neck: No stridor.   Cardiovascular: Normal rate, regular rhythm. Grossly normal heart sounds.  Good peripheral circulation. Respiratory: Normal respiratory effort.  No retractions. Lungs CTAB. Gastrointestinal: No rebound or guarding in any quadrant.  Soft and nontender except she does report some tenderness in the left lower quadrant region, and also tenderness around the left hip and posterior pelvic/gluteal region without overlying skin change, erythema, or warmth. No distention. Musculoskeletal: No lower extremity tenderness nor edema.  No tenderness or pain to palpation of the hips.  Ranges the legs well without pain or discomfort. Neurologic:  Normal speech and language. No gross focal neurologic deficits are appreciated.  Skin:  Skin is warm, dry and intact. No rash noted. Psychiatric: Mood and affect are normal. Speech and behavior are normal.  ____________________________________________   LABS (all labs ordered are listed, but only abnormal results are displayed)  Labs Reviewed  BASIC METABOLIC PANEL - Abnormal; Notable for the following components:      Result Value   Glucose, Bld 114 (*)    All other components within normal limits  URINALYSIS, ROUTINE W REFLEX MICROSCOPIC - Abnormal; Notable for the following components:   Color, Urine YELLOW (*)    APPearance HAZY (*)    Leukocytes, UA TRACE (*)    Squamous Epithelial / LPF 6-30 (*)    All other components within normal limits   CBC WITH DIFFERENTIAL/PLATELET  PREGNANCY, URINE  HCG, QUANTITATIVE, PREGNANCY   ____________________________________________  EKG   ____________________________________________  RADIOLOGY  Ultrasound performed, normal ovaries bilateral.  Fundal fibroid present CT of the abdomen and pelvis reviewed by me, no evidence of acute intra-abdominal pathology. ____________________________________________   PROCEDURES  Procedure(s) performed: None  Procedures  Critical Care performed: No  ____________________________________________   INITIAL IMPRESSION / ASSESSMENT AND PLAN / ED COURSE  Pertinent labs & imaging results that were available during my care of the patient were reviewed by me and considered in my medical decision making (see chart for details).  Differential diagnosis includes but is not  limited to, abdominal perforation, aortic dissection, cholecystitis, appendicitis, diverticulitis, colitis, esophagitis/gastritis, kidney stone, pyelonephritis, urinary tract infection, aortic aneurysm. All are considered in decision and treatment plan. Based upon the patient's presentation and risk factors, it sounds that she is having recurrent pains related to her menstrual cycle, that seem to be slowly worsening with each cycle.  Denies infectious symptoms.  Her blood work is reassuring, and her imaging studies very reassuring.  No evidence of torsion.  I will prescribe the patient a narcotic pain medicine due to their condition which I anticipate will cause at least moderate pain short term. I discussed with the patient safe use of narcotic pain medicines, and that they are not to drive, work in dangerous areas, or ever take more than prescribed (no more than 1 pill every 6 hours). We discussed that this is the type of medication that can be  overdosed on and the risks of this type of medicine. Patient is very agreeable to only use as prescribed and to never use more than prescribed.   Patient will discontinue tramadol.  Discussed with the patient, she will follow-up closely with her primary.  After pain medication, reevaluation and antiemetic she reports feeling much better.  She appears much more comfortable after rest in the bed without distress.  Do not know the exact etiology of her pain, but I suspect it may be something related to gynecologic etiology with a recurrent cyclical presentations around her menses for the last year.  Discussed with her and she will follow closely with her primary and also gynecology, certainly something such as endometriosis would be considered but difficult to diagnose at this point in the ER.  Return precautions and treatment recommendations and follow-up discussed with the patient who is agreeable with the plan.         ____________________________________________   FINAL CLINICAL IMPRESSION(S) / ED DIAGNOSES  Final diagnoses:  Pelvic pain in female      NEW MEDICATIONS STARTED DURING THIS VISIT:  New Prescriptions   ONDANSETRON (ZOFRAN ODT) 4 MG DISINTEGRATING TABLET    Take 1 tablet (4 mg total) by mouth every 6 (six) hours as needed for nausea or vomiting.   OXYCODONE (OXY IR/ROXICODONE) 5 MG IMMEDIATE RELEASE TABLET    Take 1 tablet (5 mg total) by mouth every 6 (six) hours as needed for severe pain.     Note:  This document was prepared using Dragon voice recognition software and may include unintentional dictation errors.     Sharyn Creamer, MD 09/04/17 1306

## 2017-09-04 NOTE — Discharge Instructions (Signed)
You were seen in the emergency room for abdominal pain. It is important that you follow up closely with your primary care doctor in the next couple of days.   Please return to the emergency room right away if you are to develop a fever, severe nausea, your pain becomes severe or worsens, you are unable to keep food down, begin vomiting any dark or bloody fluid, you develop any dark or bloody stools, feel dehydrated, or other new concerns or symptoms arise.  No driving today.

## 2017-09-04 NOTE — ED Notes (Signed)
Pt in ultrasound for testing

## 2017-09-05 ENCOUNTER — Encounter: Payer: Self-pay | Admitting: Internal Medicine

## 2017-09-05 DIAGNOSIS — M533 Sacrococcygeal disorders, not elsewhere classified: Secondary | ICD-10-CM | POA: Insufficient documentation

## 2017-09-05 DIAGNOSIS — K5900 Constipation, unspecified: Secondary | ICD-10-CM | POA: Insufficient documentation

## 2017-09-05 DIAGNOSIS — N83209 Unspecified ovarian cyst, unspecified side: Secondary | ICD-10-CM | POA: Insufficient documentation

## 2017-09-05 NOTE — Progress Notes (Signed)
Chief Complaint  Patient presents with  . Follow-up   Follow up  She c/o pressure in tailbone that has been present since at least 11/2016 worse with menses initially thought related to hip or low back pain and Xrays of those areas 11/2016 was negative. She denies numbness or tingling. She reports 2 weeks before and during menses she has this pressure in her tailbone esp right sided.  and she can barely sit or barely sit to have a bowel movement. Pain is 10/10 but today 8/10 and Ibuprofen is not helping.. When she initially thought was related to hip/back she went through PT which did help but this is different.  She does report she feels constipated but has been able to stool recently yesterday she tried today but had to get off the toilet 2/2 pain. She does not feel like sx's are trapped gas. She does report h/o ovarian cysts. She has not f/u OB/GYN about this issue but disc today and I rec. She is prev. Established at [facility].    Review of Systems  Constitutional: Negative for weight loss.  HENT: Negative for hearing loss.   Eyes:       No vision changes   Respiratory: Negative for shortness of breath.   Cardiovascular: Negative for chest pain.  Gastrointestinal: Positive for constipation. Negative for abdominal pain.       Denies indigestion   Skin: Negative for rash.   Past Medical History:  Diagnosis Date  . Allergy   . Asthma   . Chicken pox   . Obesity    Past Surgical History:  Procedure Laterality Date  . BREAST SURGERY    . Pelvic adhesion surgery  2007  . TUBAL LIGATION Bilateral 2007   Family History  Problem Relation Age of Onset  . Hypertension Mother   . Cancer Mother 57       one breast/tn  . Stroke Mother   . Diabetes Father   . Hypertension Father   . Cancer Father 58       liver cancer  . Lung cancer Father   . Hyperlipidemia Father   . Stroke Father   . Asthma Daughter   . Cancer Maternal Grandmother        lung cancer  . Cancer Maternal Grandfather         lung cancer  . Diabetes Paternal Grandfather   . Hypertension Paternal Grandfather    Social History   Socioeconomic History  . Marital status: Married    Spouse name: Not on file  . Number of children: Not on file  . Years of education: Not on file  . Highest education level: Not on file  Social Needs  . Financial resource strain: Not on file  . Food insecurity - worry: Not on file  . Food insecurity - inability: Not on file  . Transportation needs - medical: Not on file  . Transportation needs - non-medical: Not on file  Occupational History  . Not on file  Tobacco Use  . Smoking status: Light Tobacco Smoker    Packs/day: 0.25    Years: 10.00    Pack years: 2.50    Types: Cigarettes  . Smokeless tobacco: Never Used  Substance and Sexual Activity  . Alcohol use: Yes    Comment: rare  . Drug use: No  . Sexual activity: Yes    Partners: Male    Birth control/protection: Surgical  Other Topics Concern  . Not on file  Social History  Narrative  . Not on file   No outpatient medications have been marked as taking for the 09/02/17 encounter (Office Visit) with McLean-Scocuzza, Nino Glow, MD.   No Known Allergies Recent Results (from the past 2160 hour(s))  CBC with Differential     Status: None   Collection Time: 09/04/17  2:18 AM  Result Value Ref Range   WBC 7.7 3.6 - 11.0 K/uL   RBC 4.64 3.80 - 5.20 MIL/uL   Hemoglobin 14.2 12.0 - 16.0 g/dL   HCT 40.9 35.0 - 47.0 %   MCV 88.0 80.0 - 100.0 fL   MCH 30.6 26.0 - 34.0 pg   MCHC 34.8 32.0 - 36.0 g/dL   RDW 13.6 11.5 - 14.5 %   Platelets 250 150 - 440 K/uL   Neutrophils Relative % 65 %   Neutro Abs 4.9 1.4 - 6.5 K/uL   Lymphocytes Relative 30 %   Lymphs Abs 2.3 1.0 - 3.6 K/uL   Monocytes Relative 4 %   Monocytes Absolute 0.3 0.2 - 0.9 K/uL   Eosinophils Relative 1 %   Eosinophils Absolute 0.1 0 - 0.7 K/uL   Basophils Relative 0 %   Basophils Absolute 0.0 0 - 0.1 K/uL    Comment: Performed at [facility], [address] [place], [place] [address]  Basic metabolic panel     Status: Abnormal   Collection Time: 09/04/17  2:18 AM  Result Value Ref Range   Sodium 135 135 - 145 mmol/L   Potassium 3.7 3.5 - 5.1 mmol/L   Chloride 105 101 - 111 mmol/L   CO2 23 22 - 32 mmol/L   Glucose, Bld 114 (H) 65 - 99 mg/dL   BUN 6 6 - 20 mg/dL   Creatinine, Ser 0.80 0.44 - 1.00 mg/dL   Calcium 9.0 8.9 - 10.3 mg/dL   GFR calc non Af Amer >60 >60 mL/min   GFR calc Af Amer >60 >60 mL/min    Comment: (NOTE) The eGFR has been calculated using the CKD EPI equation. This calculation has not been validated in all clinical situations. eGFR's persistently <60 mL/min signify possible Chronic Kidney Disease.    Anion gap 7 5 - 15    Comment: Performed at [facility], [address] [place], [place] [address]  hCG, quantitative, pregnancy     Status: None   Collection Time: 09/04/17  2:18 AM  Result Value Ref Range   hCG, Beta Chain, Quant, S <1 <5 mIU/mL    Comment:          GEST. AGE      CONC.  (mIU/mL)   <=1 WEEK        5 - 50     2 WEEKS       50 - 500     3 WEEKS       100 - 10,000     4 WEEKS     1,000 - 30,000     5 WEEKS     3,500 - 115,000   6-8 WEEKS     12,000 - 270,000    12 WEEKS     15,000 - 220,000        FEMALE AND NON-PREGNANT FEMALE:     LESS THAN 5 mIU/mL Performed at [facility], [address] [place], [place] [address]   Urinalysis, Routine w reflex microscopic     Status: Abnormal   Collection Time: 09/04/17  4:47 AM  Result Value Ref  Range   Color, Urine YELLOW (A) YELLOW   APPearance HAZY (A) CLEAR   Specific Gravity, Urine 1.011 1.005 - 1.030   pH 5.0 5.0 - 8.0   Glucose, UA NEGATIVE NEGATIVE mg/dL   Hgb urine dipstick NEGATIVE NEGATIVE   Bilirubin Urine NEGATIVE NEGATIVE   Ketones, ur NEGATIVE NEGATIVE mg/dL   Protein, ur NEGATIVE NEGATIVE mg/dL   Nitrite NEGATIVE NEGATIVE   Leukocytes, UA TRACE (A) NEGATIVE   RBC / HPF 0-5 0 - 5  RBC/hpf   WBC, UA 0-5 0 - 5 WBC/hpf   Bacteria, UA NONE SEEN NONE SEEN   Squamous Epithelial / LPF 6-30 (A) NONE SEEN   Mucus PRESENT     Comment: Performed at [facility], [address] [place], [place] [address]  Pregnancy, urine     Status: None   Collection Time: 09/04/17  4:47 AM  Result Value Ref Range   Preg Test, Ur NEGATIVE NEGATIVE    Comment: Performed at [facility], [address] [place], [place] [address]   Objective  Body mass index is 31.85 kg/m. Wt Readings from Last 3 Encounters:  09/04/17 179 lb (81.2 kg)  09/02/17 179 lb 12.8 oz (81.6 kg)  12/01/16 182 lb 6.4 oz (82.7 kg)   Temp Readings from Last 3 Encounters:  09/04/17 98.9 F (37.2 C) (Oral)  09/02/17 98.1 F (36.7 C) (Oral)  12/01/16 98.5 F (36.9 C)   BP Readings from Last 3 Encounters:  09/04/17 98/77  09/02/17 104/78  12/01/16 114/80   Pulse Readings from Last 3 Encounters:  09/04/17 (!) 47  09/02/17 (!) 104  12/01/16 67   O2 sat room air 98%  Physical Exam  Constitutional: She is oriented to person, place, and time and well-developed, well-nourished, and in no distress.  HENT:  Head: Normocephalic and atraumatic.  Mouth/Throat: Oropharynx is clear and moist and mucous membranes are normal.  Eyes: Conjunctivae are normal. Pupils are equal, round, and reactive to light.  Cardiovascular: Normal rate, regular rhythm and normal heart sounds.  Pulmonary/Chest: Effort normal and breath sounds normal.  Abdominal: Soft. Bowel sounds are normal. There is no tenderness.  Musculoskeletal:       Arms: Neurological: She is alert and oriented to person, place, and time. Gait normal. Gait normal.  Skin: Skin is warm, dry and intact.  Psychiatric: Mood, memory, affect and judgment normal.  Nursing note and vitals reviewed.   Assessment   1. Pain in coccyx area worse with menses and h/o ovarian cysts ? OB/GYN etiology  2. Constipation  3. HM Plan  1.  Xray coccyx, L/S  today and abdomen 1 view to w/u #1 and 2  rec Miralax  Donut cusion for now  Will refer to OB/GYN Dr. Georgianne Fick to w/u for OB/GYN cause of pain in coccyx area around menses with h/o ovarian cysts Dr. Karen Kitchens saw the pt with me due to me DEA # expiring and Rx tramadol 50 mg tid prn  Given toradol shot in office 60 mg today x 1 and pt felt it helped somewhat  She reports urinary retention over the weekend when called pt and she will f/u with Dr. Georgianne Fick   3. Address HM at f/u   Provider: Dr. Olivia Mackie McLean-Scocuzza-Internal Medicine

## 2017-09-07 ENCOUNTER — Ambulatory Visit: Payer: 59 | Admitting: Internal Medicine

## 2017-09-07 ENCOUNTER — Ambulatory Visit (INDEPENDENT_AMBULATORY_CARE_PROVIDER_SITE_OTHER): Payer: Managed Care, Other (non HMO) | Admitting: Obstetrics and Gynecology

## 2017-09-07 ENCOUNTER — Encounter: Payer: Self-pay | Admitting: Obstetrics and Gynecology

## 2017-09-07 VITALS — BP 118/72 | HR 80 | Ht 62.0 in | Wt 180.0 lb

## 2017-09-07 DIAGNOSIS — N809 Endometriosis, unspecified: Secondary | ICD-10-CM

## 2017-09-07 DIAGNOSIS — R102 Pelvic and perineal pain: Secondary | ICD-10-CM | POA: Diagnosis not present

## 2017-09-07 MED ORDER — NORETHINDRONE ACETATE 5 MG PO TABS: 5.0000 mg | ORAL_TABLET | Freq: Every day | ORAL | 11 refills | Status: DC

## 2017-09-07 NOTE — Progress Notes (Signed)
Gynecology Pelvic Pain Evaluation   Chief Complaint:  Chief Complaint  Patient presents with  . Pelvic Pain    pain goes along with cycle and few days after  . Pelvic Pain    Referred [facility]    History of Present Illness:   Patient is a 42 y.o. G2P2 who LMP was Patient's last menstrual period was 08/27/2017 (exact date)., presents today for a problem visit.  She complains of dysmenorrhea.   Her pain is localized to the suprapubic area, described as intermittent and dull, began sometime agoa as she has had a prior laparoscopy prompted by pain, acute worsening over the past year. and its severity is described as severe. The pain radiates to the back.. She has these associated symptoms which include none.. Patient has these modifiers which include nothing that make it better and Menses and intercourse that make it worse.  Context includes: during/after intercourse and preceding and during menses..    Previous evaluation: prior imaging studies and laparoscopy. Prior Diagnosis: pelvic adhesions.. She was noted to have reportedly have significant posterior cul de sac adhesions involving the ovaries, tubes and uterus at the time of her tubal in 2007.  Did not have endometriosis mentioned.  Review of Systems: Review of Systems  Constitutional: Negative for chills and fever.  Gastrointestinal: Positive for abdominal pain. Negative for constipation, diarrhea, nausea and vomiting.  Genitourinary: Negative for dysuria, frequency and urgency.    Past Medical History:  Past Medical History:  Diagnosis Date  . Allergy   . Asthma   . Chicken pox   . Obesity     Past Surgical History:  Past Surgical History:  Procedure Laterality Date  . BREAST SURGERY     breast biopsy left breast  . Pelvic adhesion surgery  2007  . TUBAL LIGATION Bilateral 2007    Gynecologic History:  Patient's last menstrual period was 08/27/2017 (exact date). She is single partner, contraception - tubal  ligation.  Hx of STDs: none. Last Pap: 11/19/2015 Results were: NIL HPV negative Menstrual Interval: 28  days Duration of flow: 4 days Heavy Menses: yes Clots: yes Intermenstrual Bleeding: no Postcoital Bleeding: no Dysmenorrhea: yes  Obstetric History: G2P2  Family History:  Family History  Problem Relation Age of Onset  . Hypertension Mother   . Cancer Mother 39       one breast/tn  . Stroke Mother   . Diabetes Father   . Hypertension Father   . Cancer Father 52       liver cancer  . Lung cancer Father   . Hyperlipidemia Father   . Stroke Father   . Asthma Daughter   . Cancer Maternal Grandmother        lung cancer  . Cancer Maternal Grandfather        lung cancer  . Diabetes Paternal Grandfather   . Hypertension Paternal Grandfather     Social History:  Social History   Socioeconomic History  . Marital status: Married    Spouse name: Not on file  . Number of children: Not on file  . Years of education: Not on file  . Highest education level: Not on file  Social Needs  . Financial resource strain: Not on file  . Food insecurity - worry: Not on file  . Food insecurity - inability: Not on file  . Transportation needs - medical: Not on file  . Transportation needs - non-medical: Not on file  Occupational History  . Not on file  Tobacco Use  . Smoking status: Light Tobacco Smoker    Packs/day: 0.25    Years: 10.00    Pack years: 2.50    Types: Cigarettes  . Smokeless tobacco: Never Used  Substance and Sexual Activity  . Alcohol use: Yes    Comment: rare  . Drug use: No  . Sexual activity: Yes    Partners: Male    Birth control/protection: Surgical  Other Topics Concern  . Not on file  Social History Narrative  . Not on file    Allergies:  No Known Allergies  Medications: Prior to Admission medications   Medication Sig Start Date End Date Taking? Authorizing Provider  ibuprofen (ADVIL,MOTRIN) 800 MG tablet Take 800 mg by mouth every 8 (eight)  hours as needed.   Yes [provider]  oxyCODONE (OXY IR/ROXICODONE) 5 MG immediate release tablet Take 1 tablet (5 mg total) by mouth every 6 (six) hours as needed for severe pain. 09/04/17  Yes Sharyn CreamerQuale, Mark, MD  norethindrone (AYGESTIN) 5 MG tablet Take 1 tablet (5 mg total) by mouth daily. 09/07/17   Vena AustriaStaebler, Annika Selke, MD    Physical Exam Vitals: Blood pressure 118/72, pulse 80, height 5\' 2"  (1.575 m), weight 180 lb (81.6 kg), last menstrual period 08/27/2017.  General: NAD HEENT: normocephalic, anicteric Pulmonary: No increased work of breathing Abdomen: Soft, non-tender, non-distended.  Umbilicus without lesions.  No hepatomegaly, splenomegaly or masses palpable. No evidence of hernia  Genitourinary:  External: Normal external female genitalia.  Normal urethral meatus, normal  Bartholin's and Skene's glands.    Vagina: Normal vaginal mucosa, no evidence of prolapse.    Cervix: Grossly normal in appearance, no bleeding  Uterus: Non-enlarged, mobile, normal contour.  No CMT  Adnexa: ovaries non-enlarged, no adnexal masses  Rectal: deferred  Lymphatic: no evidence of inguinal lymphadenopathy Extremities: no edema, erythema, or tenderness Neurologic: Grossly intact Psychiatric: mood appropriate, affect full  Female chaperone present for pelvic portion of the physical exam  Dg Sacrum/coccyx  Result Date: 09/02/2017 CLINICAL DATA:  Right hip pain since May 2018.  No reported injury. EXAM: SACRUM AND COCCYX - 2+ VIEW COMPARISON:  11/30/2016 FINDINGS: There is no evidence of fracture or other focal bone lesions. IMPRESSION: Negative. Electronically Signed   By: Amie Portlandavid  Ormond M.D.   On: 09/02/2017 17:22   Dg Abd 1 View  Result Date: 09/02/2017 CLINICAL DATA:  Coccygeal region pain.  Constipation. EXAM: ABDOMEN - 1 VIEW COMPARISON:  None. FINDINGS: Normal bowel gas pattern.  Mild colonic stool burden. No evidence of renal or ureteral stones. Mild levoscoliosis of the mid lumbar spine.  Mild dextroscoliosis at the thoracolumbar junction. Skeletal structures otherwise unremarkable. IMPRESSION: 1. No acute findings.  No evidence of bowel obstruction. 2. Mild colonic stool burden. Electronically Signed   By: Amie Portlandavid  Ormond M.D.   On: 09/02/2017 17:23   [place] Transvaginal Non-ob  Result Date: 09/04/2017 CLINICAL DATA:  Lower back pain for 1 year, worse with masses. EXAM: TRANSABDOMINAL AND TRANSVAGINAL ULTRASOUND OF PELVIS DOPPLER ULTRASOUND OF OVARIES TECHNIQUE: Both transabdominal and transvaginal ultrasound examinations of the pelvis were performed. Transabdominal technique was performed for global imaging of the pelvis including uterus, ovaries, adnexal regions, and pelvic cul-de-sac. It was necessary to proceed with endovaginal exam following the transabdominal exam to visualize the endometrium. Color and duplex Doppler ultrasound was utilized to evaluate blood flow to the ovaries. COMPARISON:  None. FINDINGS: Uterus Measurements: 9.4 x 4.4 x 5.5 cm. There is a 2.5 x 2.1 x 2.4 cm  intramural right anterior fundal mass. Endometrium Thickness: 6.4 mm.  No focal abnormality visualized. Right ovary Measurements: 2.6 x 1.8 x 2.1 cm. Normal appearance/no adnexal mass. Left ovary Measurements: 2.5 x 2.5 x 1.5 cm. Normal appearance/no adnexal mass. Pulsed Doppler evaluation of both ovaries demonstrates normal low-resistance arterial and venous waveforms. Other findings No abnormal free fluid. IMPRESSION: Normal appearance of the ovaries. 2.5 cm anterior fundal fibroid. Electronically Signed   By: Ted Mcalpine M.D.   On: 09/04/2017 10:36   US Pelvis Complete  Result Date: 09/04/2017 CLINICAL DATA:  Lower back pain for 1 year, worse with masses. EXAM: TRANSABDOMINAL AND TRANSVAGINAL ULTRASOUND OF PELVIS DOPPLER ULTRASOUND OF OVARIES TECHNIQUE: Both transabdominal and transvaginal ultrasound examinations of the pelvis were performed. Transabdominal technique was performed for global imaging of the  pelvis including uterus, ovaries, adnexal regions, and pelvic cul-de-sac. It was necessary to proceed with endovaginal exam following the transabdominal exam to visualize the endometrium. Color and duplex Doppler ultrasound was utilized to evaluate blood flow to the ovaries. COMPARISON:  None. FINDINGS: Uterus Measurements: 9.4 x 4.4 x 5.5 cm. There is a 2.5 x 2.1 x 2.4 cm intramural right anterior fundal mass. Endometrium Thickness: 6.4 mm.  No focal abnormality visualized. Right ovary Measurements: 2.6 x 1.8 x 2.1 cm. Normal appearance/no adnexal mass. Left ovary Measurements: 2.5 x 2.5 x 1.5 cm. Normal appearance/no adnexal mass. Pulsed Doppler evaluation of both ovaries demonstrates normal low-resistance arterial and venous waveforms. Other findings No abnormal free fluid. IMPRESSION: Normal appearance of the ovaries. 2.5 cm anterior fundal fibroid. Electronically Signed   By: Ted Mcalpine M.D.   On: 09/04/2017 10:36   Ct Abdomen Pelvis W Contrast  Result Date: 09/04/2017 CLINICAL DATA:  Difficulty urinating. EXAM: CT ABDOMEN AND PELVIS WITH CONTRAST TECHNIQUE: Multidetector CT imaging of the abdomen and pelvis was performed using the standard protocol following bolus administration of intravenous contrast. CONTRAST:  ISOVUE-300 IOPAMIDOL (ISOVUE-300) INJECTION 61% COMPARISON:  Pelvic ultrasound 09/24/2017, body CT 08/27/2012 FINDINGS: Lower chest: No acute abnormality. Hepatobiliary: No focal liver abnormality is seen. No gallstones, gallbladder wall thickening, or biliary dilatation. Pancreas: Unremarkable. No pancreatic ductal dilatation or surrounding inflammatory changes. Spleen: Normal in size without focal abnormality. Adrenals/Urinary Tract: Adrenal glands are unremarkable. Kidneys are normal, without renal calculi, focal lesion, or hydronephrosis. Bladder is unremarkable. Stomach/Bowel: Stomach is within normal limits. Appendix appears normal. No evidence of bowel wall thickening,  distention, or inflammatory changes. Vascular/Lymphatic: No significant vascular findings are present. No enlarged abdominal or pelvic lymph nodes. Reproductive: Anterior fundal fibroid noted. Bilateral adnexa are unremarkable. Other: No abdominal wall hernia or abnormality. No abdominopelvic ascites. Musculoskeletal: No acute or significant osseous findings. IMPRESSION: No evidence of acute abnormalities within the abdomen or pelvis. Electronically Signed   By: Ted Mcalpine M.D.   On: 09/04/2017 11:52   US Renal  Result Date: 09/04/2017 CLINICAL DATA:  Lower back pain. EXAM: RENAL / URINARY TRACT ULTRASOUND COMPLETE COMPARISON:  None. FINDINGS: Right Kidney: Length: 10.0 cm. Echogenicity within normal limits. No mass or hydronephrosis visualized. Left Kidney: Length: 10.3 cm. Echogenicity within normal limits. No mass or hydronephrosis visualized. Bladder: Appears normal for degree of bladder distention. IMPRESSION: Normal renal ultrasound. Electronically Signed   By: Ted Mcalpine M.D.   On: 09/04/2017 10:33   [place] Art/ven Flow Abd Pelv Doppler  Result Date: 09/04/2017 CLINICAL DATA:  Lower back pain for 1 year, worse with masses. EXAM: TRANSABDOMINAL AND TRANSVAGINAL ULTRASOUND OF PELVIS DOPPLER ULTRASOUND OF OVARIES TECHNIQUE: Both  transabdominal and transvaginal ultrasound examinations of the pelvis were performed. Transabdominal technique was performed for global imaging of the pelvis including uterus, ovaries, adnexal regions, and pelvic cul-de-sac. It was necessary to proceed with endovaginal exam following the transabdominal exam to visualize the endometrium. Color and duplex Doppler ultrasound was utilized to evaluate blood flow to the ovaries. COMPARISON:  None. FINDINGS: Uterus Measurements: 9.4 x 4.4 x 5.5 cm. There is a 2.5 x 2.1 x 2.4 cm intramural right anterior fundal mass. Endometrium Thickness: 6.4 mm.  No focal abnormality visualized. Right ovary Measurements: 2.6 x 1.8 x 2.1 cm.  Normal appearance/no adnexal mass. Left ovary Measurements: 2.5 x 2.5 x 1.5 cm. Normal appearance/no adnexal mass. Pulsed Doppler evaluation of both ovaries demonstrates normal low-resistance arterial and venous waveforms. Other findings No abnormal free fluid. IMPRESSION: Normal appearance of the ovaries. 2.5 cm anterior fundal fibroid. Electronically Signed   By: Ted Mcalpine M.D.   On: 09/04/2017 10:36   Assessment: 42 y.o. G2P2 with chronic pelvic pain with symptoms consistent with endometriosis and operative findings a time of laparoscopy also suspicious for endometriosis in 2007  Problem List Items Addressed This Visit    None    Visit Diagnoses    Pelvic pain    -  Primary   Endometriosis           We discussed the possible etiologies for pelvic pain in women.  Gynecologic causes may include endometriosis, adenomyosis, pelvic inflammatory disease (PID), ovarian cysts, ovarian or tubal torsion, and in rare case gynecologic malignancy such as cervical, uterine, or ovarian cancer.  In addition thee possibility of non-gynecologic etiologies such as urinary or GI tract pathology or disordered, as well as musculoskeletal problems.  The goal is to complete a basic work up in hopes of identifying the underlying cause which in turn will dictate treatment.  In the meantime supportive measures such as localized heat, and NSAIDs are reasonable first steps.     - Prescription drug database was not reviewed, UDS was not ordered - CT abdomen and pelvis and ultrasound  09/04/17 reviewed - Blood work obtained today No  - Cervical cultures No - UA 09/04/17 reviewed - A total of 15 minutes were spent in face-to-face contact with the patient during this encounter with over half of that time devoted to counseling and coordination of care.  Return in about 3 months (around 12/05/2017) for medication follow up.         19x48mm anterior uterine fibroid 12/03/2015, 09/04/17 2.3cm 11/19/2015 NIL HPV  negative Prior BTL Trial norethindrone Worse with menses, some dysparuinia Prior laparoscopy tubes and ovaries stuck behind uterus

## 2017-09-14 ENCOUNTER — Ambulatory Visit: Payer: Managed Care, Other (non HMO) | Admitting: Internal Medicine

## 2017-09-15 ENCOUNTER — Telehealth: Payer: Self-pay

## 2017-09-15 NOTE — Telephone Encounter (Signed)
FMLA/DISABILITY form for [employer] filled out, signature obtained, and given to [place] for processing. 

## 2017-10-07 ENCOUNTER — Encounter: Payer: Self-pay | Admitting: Internal Medicine

## 2017-10-07 ENCOUNTER — Other Ambulatory Visit: Payer: Self-pay | Admitting: Internal Medicine

## 2017-10-07 ENCOUNTER — Ambulatory Visit (INDEPENDENT_AMBULATORY_CARE_PROVIDER_SITE_OTHER): Payer: Managed Care, Other (non HMO) | Admitting: Internal Medicine

## 2017-10-07 VITALS — BP 104/86 | HR 62 | Temp 98.6°F | Ht 62.0 in | Wt 178.6 lb

## 2017-10-07 DIAGNOSIS — Z1231 Encounter for screening mammogram for malignant neoplasm of breast: Secondary | ICD-10-CM

## 2017-10-07 DIAGNOSIS — R51 Headache: Secondary | ICD-10-CM

## 2017-10-07 DIAGNOSIS — N809 Endometriosis, unspecified: Secondary | ICD-10-CM | POA: Diagnosis not present

## 2017-10-07 DIAGNOSIS — Z23 Encounter for immunization: Secondary | ICD-10-CM | POA: Diagnosis not present

## 2017-10-07 DIAGNOSIS — L600 Ingrowing nail: Secondary | ICD-10-CM | POA: Diagnosis not present

## 2017-10-07 DIAGNOSIS — Z803 Family history of malignant neoplasm of breast: Secondary | ICD-10-CM | POA: Diagnosis not present

## 2017-10-07 DIAGNOSIS — R519 Headache, unspecified: Secondary | ICD-10-CM | POA: Insufficient documentation

## 2017-10-07 MED ORDER — MUPIROCIN 2 % EX OINT: 1.0000 "application " | TOPICAL_OINTMENT | Freq: Three times a day (TID) | CUTANEOUS | 0 refills | Status: DC

## 2017-10-07 NOTE — Progress Notes (Signed)
Pre visit review using our clinic review tool, if applicable. No additional management support is needed unless otherwise documented below in the visit note. 

## 2017-10-07 NOTE — Progress Notes (Signed)
Chief Complaint  Patient presents with  . Establish Care   Follow up  1. Endometriosis dx new but likely had x 11-13 years saw OB/GYN on Aygestin causing night sweats and possibly h/a but tolerable per pt  2. Frontal h/a x 1 w/in the last few weeks no stress, getting enough sleep, drinking 1 cup coffee and 1 mt dew daily. H/a was 6/10 no n/v. No h/o migraines. All she wanted to do that day was lie in bed.  3. FH breast cancer in mother with 1 type in the past and another type of breast cancer appeared in the same breast.  4. C/o right ingrown toe x few weeks painful     Review of Systems  Constitutional: Negative for weight loss.  HENT: Negative for hearing loss.   Eyes: Negative for blurred vision.  Respiratory: Negative for shortness of breath.   Cardiovascular: Negative for chest pain.  Gastrointestinal: Negative for abdominal pain.  Genitourinary:       +endometriosis pain better   Skin: Negative for rash.  Neurological: Positive for headaches.  Psychiatric/Behavioral: Negative for depression and memory loss.   Past Medical History:  Diagnosis Date  . Allergy   . Asthma   . Chicken pox   . Endometriosis   . Obesity    Past Surgical History:  Procedure Laterality Date  . BREAST SURGERY     breast biopsy left breast  . Pelvic adhesion surgery  2007  . TUBAL LIGATION Bilateral 2007   Family History  Problem Relation Age of Onset  . Hypertension Mother   . Cancer Mother 47       one breast/tn; recurrence with a different type of breast cancer  . Stroke Mother   . Diabetes Father   . Hypertension Father   . Cancer Father 11       liver cancer  . Lung cancer Father   . Hyperlipidemia Father   . Stroke Father   . Asthma Daughter   . Cancer Maternal Grandmother        lung cancer  . Cancer Maternal Grandfather        lung cancer  . Diabetes Paternal Grandfather   . Hypertension Paternal Grandfather    Social History   Socioeconomic History  . Marital  status: Married    Spouse name: Not on file  . Number of children: Not on file  . Years of education: Not on file  . Highest education level: Not on file  Social Needs  . Financial resource strain: Not on file  . Food insecurity - worry: Not on file  . Food insecurity - inability: Not on file  . Transportation needs - medical: Not on file  . Transportation needs - non-medical: Not on file  Occupational History  . Not on file  Tobacco Use  . Smoking status: Light Tobacco Smoker    Packs/day: 0.25    Years: 10.00    Pack years: 2.50    Types: Cigarettes  . Smokeless tobacco: Never Used  Substance and Sexual Activity  . Alcohol use: Yes    Comment: rare  . Drug use: No  . Sexual activity: Yes    Partners: Male    Birth control/protection: Surgical  Other Topics Concern  . Not on file  Social History Narrative   Married    Works [employer]    2 kids    Current Meds  Medication Sig  . ibuprofen (ADVIL,MOTRIN) 800 MG tablet Take 800 mg by  mouth every 8 (eight) hours as needed.  . norethindrone (AYGESTIN) 5 MG tablet Take 1 tablet (5 mg total) by mouth daily.   No Known Allergies Recent Results (from the past 2160 hour(s))  CBC with Differential     Status: None   Collection Time: 09/04/17  2:18 AM  Result Value Ref Range   WBC 7.7 3.6 - 11.0 K/uL   RBC 4.64 3.80 - 5.20 MIL/uL   Hemoglobin 14.2 12.0 - 16.0 g/dL   HCT 40.9 35.0 - 47.0 %   MCV 88.0 80.0 - 100.0 fL   MCH 30.6 26.0 - 34.0 pg   MCHC 34.8 32.0 - 36.0 g/dL   RDW 13.6 11.5 - 14.5 %   Platelets 250 150 - 440 K/uL   Neutrophils Relative % 65 %   Neutro Abs 4.9 1.4 - 6.5 K/uL   Lymphocytes Relative 30 %   Lymphs Abs 2.3 1.0 - 3.6 K/uL   Monocytes Relative 4 %   Monocytes Absolute 0.3 0.2 - 0.9 K/uL   Eosinophils Relative 1 %   Eosinophils Absolute 0.1 0 - 0.7 K/uL   Basophils Relative 0 %   Basophils Absolute 0.0 0 - 0.1 K/uL    Comment: Performed at [facility], [address] [place],  [place] [address]  Basic metabolic panel     Status: Abnormal   Collection Time: 09/04/17  2:18 AM  Result Value Ref Range   Sodium 135 135 - 145 mmol/L   Potassium 3.7 3.5 - 5.1 mmol/L   Chloride 105 101 - 111 mmol/L   CO2 23 22 - 32 mmol/L   Glucose, Bld 114 (H) 65 - 99 mg/dL   BUN 6 6 - 20 mg/dL   Creatinine, Ser 0.80 0.44 - 1.00 mg/dL   Calcium 9.0 8.9 - 10.3 mg/dL   GFR calc non Af Amer >60 >60 mL/min   GFR calc Af Amer >60 >60 mL/min    Comment: (NOTE) The eGFR has been calculated using the CKD EPI equation. This calculation has not been validated in all clinical situations. eGFR's persistently <60 mL/min signify possible Chronic Kidney Disease.    Anion gap 7 5 - 15    Comment: Performed at [facility], [address] [place], [place] [address]  hCG, quantitative, pregnancy     Status: None   Collection Time: 09/04/17  2:18 AM  Result Value Ref Range   hCG, Beta Chain, Quant, S <1 <5 mIU/mL    Comment:          GEST. AGE      CONC.  (mIU/mL)   <=1 WEEK        5 - 50     2 WEEKS       50 - 500     3 WEEKS       100 - 10,000     4 WEEKS     1,000 - 30,000     5 WEEKS     3,500 - 115,000   6-8 WEEKS     12,000 - 270,000    12 WEEKS     15,000 - 220,000        FEMALE AND NON-PREGNANT FEMALE:     LESS THAN 5 mIU/mL Performed at [facility], [address] [place], [place] [address]   Urinalysis, Routine w reflex microscopic     Status: Abnormal   Collection Time: 09/04/17  4:47 AM  Result Value Ref Range   Color,  Urine YELLOW (A) YELLOW   APPearance HAZY (A) CLEAR   Specific Gravity, Urine 1.011 1.005 - 1.030   pH 5.0 5.0 - 8.0   Glucose, UA NEGATIVE NEGATIVE mg/dL   Hgb urine dipstick NEGATIVE NEGATIVE   Bilirubin Urine NEGATIVE NEGATIVE   Ketones, ur NEGATIVE NEGATIVE mg/dL   Protein, ur NEGATIVE NEGATIVE mg/dL   Nitrite NEGATIVE NEGATIVE   Leukocytes, UA TRACE (A) NEGATIVE   RBC / HPF 0-5 0 - 5 RBC/hpf   WBC, UA 0-5 0 - 5 WBC/hpf   Bacteria,  UA NONE SEEN NONE SEEN   Squamous Epithelial / LPF 6-30 (A) NONE SEEN   Mucus PRESENT     Comment: Performed at [facility], [address] [place], [place] [address]  Pregnancy, urine     Status: None   Collection Time: 09/04/17  4:47 AM  Result Value Ref Range   Preg Test, Ur NEGATIVE NEGATIVE    Comment: Performed at [facility], [address] [place], [place] [address]   Objective  Body mass index is 32.67 kg/m. Wt Readings from Last 3 Encounters:  10/07/17 178 lb 9.6 oz (81 kg)  09/07/17 180 lb (81.6 kg)  09/04/17 179 lb (81.2 kg)   Temp Readings from Last 3 Encounters:  10/07/17 98.6 F (37 C) (Oral)  09/04/17 98.9 F (37.2 C) (Oral)  09/02/17 98.1 F (36.7 C) (Oral)   BP Readings from Last 3 Encounters:  10/07/17 104/86  09/07/17 118/72  09/04/17 98/77   Pulse Readings from Last 3 Encounters:  10/07/17 62  09/07/17 80  09/04/17 (!) 47   O2 sat room air 98% Physical Exam  Constitutional: She is oriented to person, place, and time and well-developed, well-nourished, and in no distress. Vital signs are normal.  HENT:  Head: Normocephalic and atraumatic.  Mouth/Throat: Oropharynx is clear and moist and mucous membranes are normal.  Eyes: Conjunctivae are normal. Pupils are equal, round, and reactive to light.  Cardiovascular: Normal rate, regular rhythm and normal heart sounds.  Pulmonary/Chest: Effort normal and breath sounds normal. Right breast exhibits tenderness. Right breast exhibits no inverted nipple, no mass, no nipple discharge and no skin change. Left breast exhibits no inverted nipple, no mass, no nipple discharge, no skin change and no tenderness. Breasts are symmetrical.    Abdominal: Soft. Bowel sounds are normal.  Neurological: She is alert and oriented to person, place, and time. Gait normal. Gait normal.  Skin: Skin is warm, dry and intact.  Psychiatric: Mood, memory, affect and judgment normal.  Nursing note and vitals  reviewed.   Assessment   1. endometriosis improved 2. H/a 3. FH breast cancer  4. Ingrown toenail 5. HM Plan  1. Cont Aygestin f/u OB/GYN Dr. Georgianne Fick  2. Prn OTC meds prn  3. Will check BRCA 1/2 assure with [facility] given form and ask if Dr. Georgianne Fick can check my myriad  4. Supportive care, trial of bactroban  5.  Declines flu shot  Given Tdap today  Check hep B status  Disc HPV vaccine today to disc with OB/GYN   Pap will ask Dr. Georgianne Fick do at f/u 12/05/17  Referred for mammogram today  Other labs [facility] form [facility], lipid, TSH, T4, hep B, vit D   Provider: Dr. Olivia Mackie McLean-Scocuzza-Internal Medicine

## 2017-10-07 NOTE — Patient Instructions (Addendum)
Ask Dr. Georgianne Fick about pap, my myriad testing and HPV vaccine  F/u in 3-4 months sooner if needed  Get labs as soon as possible fasting x 12 hours only water and medications.   HPV (Human Papillomavirus) Vaccine: What You Need to Know 1. Why get vaccinated? HPV vaccine prevents infection with human papillomavirus (HPV) types that are associated with many cancers, including:  cervical cancer in females,  vaginal and vulvar cancers in females,  anal cancer in females and males,  throat cancer in females and males, and  penile cancer in males.  In addition, HPV vaccine prevents infection with HPV types that cause genital warts in both females and males. In the [place] about 12,000 women get cervical cancer every year, and about 4,000 women die from it. HPV vaccine can prevent most of these cases of cervical cancer. Vaccination is not a substitute for cervical cancer screening. This vaccine does not protect against all HPV types that can cause cervical cancer. Women should still get regular Pap tests. HPV infection usually comes from sexual contact, and most people will become infected at some point in their life. About 14 million Americans, including teens, get infected every year. Most infections will go away on their own and not cause serious problems. But thousands of women and men get cancer and other diseases from HPV. 2. HPV vaccine HPV vaccine is approved by FDA and is recommended by CDC for both males and females. It is routinely given at 36 or 40 years of age, but it may be given beginning at age 45 years through age 89 years. Most adolescents 9 through 40 years of age should get HPV vaccine as a two-dose series with the doses separated by 6-12 months. People who start HPV vaccination at 70 years of age and older should get the vaccine as a three-dose series with the second dose given 1-2 months after the first dose and the third dose given 6 months after the first dose. There are  several exceptions to these age recommendations. Your health care provider can give you more information. 3. Some people should not get this vaccine  Anyone who has had a severe (life-threatening) allergic reaction to a dose of HPV vaccine should not get another dose.  Anyone who has a severe (life threatening) allergy to any component of HPV vaccine should not get the vaccine.  Tell your doctor if you have any severe allergies that you know of, including a severe allergy to yeast.  HPV vaccine is not recommended for pregnant women. If you learn that you were pregnant when you were vaccinated, there is no reason to expect any problems for you or your baby. Any woman who learns she was pregnant when she got HPV vaccine is encouraged to contact the manufacturer's registry for HPV vaccination during pregnancy at (518)798-0706. Women who are breastfeeding may be vaccinated.  If you have a mild illness, such as a cold, you can probably get the vaccine today. If you are moderately or severely ill, you should probably wait until you recover. Your doctor can advise you. 4. Risks of a vaccine reaction With any medicine, including vaccines, there is a chance of side effects. These are usually mild and go away on their own, but serious reactions are also possible. Most people who get HPV vaccine do not have any serious problems with it. Mild or moderate problems following HPV vaccine:  Reactions in the arm where the shot was given: ? Soreness (about 9 people in  10) ? Redness or swelling (about 1 person in 3)  Fever: ? Mild (100F) (about 1 person in 10) ? Moderate (102F) (about 1 person in 61)  Other problems: ? Headache (about 1 person in 3) Problems that could happen after any injected vaccine:  People sometimes faint after a medical procedure, including vaccination. Sitting or lying down for about 15 minutes can help prevent fainting, and injuries caused by a fall. Tell your doctor if you feel  dizzy, or have vision changes or ringing in the ears.  Some people get severe pain in the shoulder and have difficulty moving the arm where a shot was given. This happens very rarely.  Any medication can cause a severe allergic reaction. Such reactions from a vaccine are very rare, estimated at about 1 in a million doses, and would happen within a few minutes to a few hours after the vaccination. As with any medicine, there is a very remote chance of a vaccine causing a serious injury or death. The safety of vaccines is always being monitored. For more information, visit: http://www.aguilar.org/. 5. What if there is a serious reaction? What should I look for? Look for anything that concerns you, such as signs of a severe allergic reaction, very high fever, or unusual behavior. Signs of a severe allergic reaction can include hives, swelling of the face and throat, difficulty breathing, a fast heartbeat, dizziness, and weakness. These would usually start a few minutes to a few hours after the vaccination. What should I do? If you think it is a severe allergic reaction or other emergency that can't wait, call 9-1-1 or get to the nearest hospital. Otherwise, call your doctor. Afterward, the reaction should be reported to the Vaccine Adverse Event Reporting System (VAERS). Your doctor should file this report, or you can do it yourself through the VAERS web site at www.vaers.SamedayNews.es, or by calling 5176872390. VAERS does not give medical advice. 6. The National Vaccine Injury Compensation Program The [employer] Vaccine Injury Compensation Program (VICP) is a federal program that was created to compensate people who may have been injured by certain vaccines. Persons who believe they may have been injured by a vaccine can learn about the program and about filing a claim by calling 603-253-6782 or visiting the [place] website at GoldCloset.com.ee. There is a time limit to file a claim for  compensation. 7. How can I learn more?  Ask your health care provider. He or she can give you the vaccine package insert or suggest other sources of information.  Call your local or state health department.  Contact the Centers for Disease Control and Prevention (CDC): ? Call 612 271 9606 (1-800-CDC-INFO) or ? Visit CDC's website at http://sweeney-todd.com/ Vaccine Information Statement, HPV Vaccine (07/04/2015) This information is not intended to replace advice given to you by your health care provider. Make sure you discuss any questions you have with your health care provider. Document Released: 02/13/2014 Document Revised: 04/08/2016 Document Reviewed: 04/08/2016 Elsevier Interactive Patient Education  2017 [place] An ingrown toenail occurs when the corner or sides of your toenail grow into the surrounding skin. The big toe is most commonly affected, but it can happen to any of your toes. If your ingrown toenail is not treated, you will be at risk for infection. What are the causes? This condition may be caused by:  Wearing shoes that are too small or tight.  Injury or trauma, such as stubbing your toe or having your toe stepped on.  Improper  cutting or care of your toenails.  Being born with (congenital) nail or foot abnormalities, such as having a nail that is too big for your toe.  What increases the risk? Risk factors for an ingrown toenail include:  Age. Your nails tend to thicken as you get older, so ingrown nails are more common in older people.  Diabetes.  Cutting your toenails incorrectly.  Blood circulation problems.  What are the signs or symptoms? Symptoms may include:  Pain, soreness, or tenderness.  Redness.  Swelling.  Hardening of the skin surrounding the toe.  Your ingrown toenail may be infected if there is fluid, pus, or drainage. How is this diagnosed? An ingrown toenail may be diagnosed by medical history and physical exam.  If your toenail is infected, your health care provider may test a sample of the drainage. How is this treated? Treatment depends on the severity of your ingrown toenail. Some ingrown toenails may be treated at home. More severe or infected ingrown toenails may require surgery to remove all or part of the nail. Infected ingrown toenails may also be treated with antibiotic medicines. Follow these instructions at home:  If you were prescribed an antibiotic medicine, finish all of it even if you start to feel better.  Soak your foot in warm soapy water for 20 minutes, 3 times per day or as directed by your health care provider.  Carefully lift the edge of the nail away from the sore skin by wedging a small piece of cotton under the corner of the nail. This may help with the pain. Be careful not to cause more injury to the area.  Wear shoes that fit well. If your ingrown toenail is causing you pain, try wearing sandals, if possible.  Trim your toenails regularly and carefully. Do not cut them in a curved shape. Cut your toenails straight across. This prevents injury to the skin at the corners of the toenail.  Keep your feet clean and dry.  If you are having trouble walking and are given crutches by your health care provider, use them as directed.  Do not pick at your toenail or try to remove it yourself.  Take medicines only as directed by your health care provider.  Keep all follow-up visits as directed by your health care provider. This is important. Contact a health care provider if:  Your symptoms do not improve with treatment. Get help right away if:  You have red streaks that start at your foot and go up your leg.  You have a fever.  You have increased redness, swelling, or pain.  You have fluid, blood, or pus coming from your toenail. This information is not intended to replace advice given to you by your health care provider. Make sure you discuss any questions you have with  your health care provider. Document Released: 07/16/2000 Document Revised: 12/19/2015 Document Reviewed: 06/12/2014 Elsevier Interactive Patient Education  [employer].    Endometriosis Endometriosis is a condition in which the tissue that lines the uterus (endometrium) grows outside of its normal location. The tissue may grow in many locations close to the uterus, but it commonly grows on the ovaries, fallopian tubes, vagina, or bowel. When the uterus sheds the endometrium every menstrual cycle, there is bleeding wherever the endometrial tissue is located. This can cause pain because blood is irritating to tissues that are not normally exposed to it. What are the causes? The cause of endometriosis is not known. What increases the risk? You  may be more likely to develop endometriosis if you:  Have a family history of endometriosis.  Have never given birth.  Started your period at age 42 or younger.  Have high levels of estrogen in your body.  Were exposed to a certain medicine (diethylstilbestrol) before you were born (in utero).  Had low birth weight.  Were born as a twin, triplet, or other multiple.  Have a BMI of less than 25. BMI is an estimate of body fat and is calculated from height and weight.  What are the signs or symptoms? Often, there are no symptoms of this condition. If you do have symptoms, they may:  Vary depending on where your endometrial tissue is growing.  Occur during your menstrual period (most common) or midcycle.  Come and go, or you may go months with no symptoms at all.  Stop with menopause.  Symptoms may include:  Pain in the back or abdomen.  Heavier bleeding during periods.  Pain during sex.  Painful bowel movements.  Infertility.  Pelvic pain.  Bleeding more than once a month.  How is this diagnosed? This condition is diagnosed based on your symptoms and a physical exam. You may have tests, such as:  Blood tests and urine  tests. These may be done to help rule out other possible causes of your symptoms.  Ultrasound, to look for abnormal tissues.  An X-ray of the lower bowel (barium enema).  An ultrasound that is done through the vagina (transvaginally).  CT scan.  MRI.  Laparoscopy. In this procedure, a lighted, pencil-sized instrument called a laparoscope is inserted into your abdomen through an incision. The laparoscope allows your health care provider to look at the organs inside your body and check for abnormal tissue to confirm the diagnosis. If abnormal tissue is found, your health care provider may remove a small piece of tissue (biopsy) to be examined under a microscope.  How is this treated? Treatment for this condition may include:  Medicines to relieve pain, such as NSAIDs.  Hormone therapy. This involves using artificial (synthetic) hormones to reduce endometrial tissue growth. Your health care provider may recommend using a hormonal form of birth control, or other medicines.  Surgery. This may be done to remove abnormal endometrial tissue. ? In some cases, tissue may be removed using a laparoscope and a laser (laparoscopic laser treatment). ? In severe cases, surgery may be done to remove the fallopian tubes, uterus, and ovaries (hysterectomy).  Follow these instructions at home:  Take over-the-counter and prescription medicines only as told by your health care provider.  Do not drive or use heavy machinery while taking prescription pain medicine.  Try to avoid activities that cause pain, including sexual activity.  Keep all follow-up visits as told by your health care provider. This is important. Contact a health care provider if:  You have pain in the area between your hip bones (pelvic area) that occurs: ? Before, during, or after your period. ? In between your period and gets worse during your period. ? During or after sex. ? With bowel movements or urination, especially during  your period.  You have problems getting pregnant.  You have a fever. Get help right away if:  You have severe pain that does not get better with medicine.  You have severe nausea and vomiting, or you cannot eat without vomiting.  You have pain that affects only the lower, right side of your abdomen.  You have abdominal pain that gets worse.  You have abdominal swelling.  You have blood in your stool. This information is not intended to replace advice given to you by your health care provider. Make sure you discuss any questions you have with your health care provider. Document Released: 07/16/2000 Document Revised: 04/23/2016 Document Reviewed: 12/20/2015 Elsevier Interactive Patient Education  [employer].

## 2017-10-12 ENCOUNTER — Telehealth: Payer: Self-pay

## 2017-10-12 NOTE — Telephone Encounter (Signed)
FMLA/DISABILITY form for clarification for [employer] filled out, signature obtained and given to [place] for processing.

## 2017-10-28 ENCOUNTER — Other Ambulatory Visit: Payer: Self-pay | Admitting: Internal Medicine

## 2017-11-02 ENCOUNTER — Telehealth: Payer: Self-pay

## 2017-11-02 NOTE — Telephone Encounter (Signed)
We have yet to receive fax as of yet.

## 2017-11-02 NOTE — Telephone Encounter (Signed)
Fax has been placed in provider folder for signature.

## 2017-11-02 NOTE — Telephone Encounter (Signed)
Copied from CRM [id number]. Topic: Quick Communication - See Telephone Encounter >> Nov 02, 2017  3:32 PM Windy KalataMichael, Taylor L, NT wrote: CRM for notification. See Telephone encounter for: 11/02/17.  [facility] is calling to follow up on a fax that was sent over and needs to be signed. Please advise.  Cb# [phone]

## 2017-11-08 ENCOUNTER — Encounter: Payer: Self-pay | Admitting: Internal Medicine

## 2017-11-09 MED ORDER — VITAMIN D (ERGOCALCIFEROL) 1.25 MG (50000 UNIT) PO CAPS: 50000.0000 [IU] | ORAL_CAPSULE | ORAL | 0 refills | Status: DC

## 2017-11-09 NOTE — Telephone Encounter (Signed)
[facility] called to see if the form has been signed and faxed back

## 2017-11-09 NOTE — Telephone Encounter (Signed)
Please follow-up on this form.  I see the form has been placed in the folder for the provider.  Please check to see if this has been completed.

## 2017-11-09 NOTE — Addendum Note (Signed)
Addended by: Elise BenneBOOTH, Amzie Sillas T on: 11/09/2017 09:03 AM   Modules accepted: Orders

## 2017-11-09 NOTE — Telephone Encounter (Signed)
Still awaiting to be done. 

## 2017-11-10 NOTE — Telephone Encounter (Signed)
Paper work has been faxed

## 2017-12-05 ENCOUNTER — Ambulatory Visit: Payer: Managed Care, Other (non HMO) | Admitting: Obstetrics and Gynecology

## 2017-12-05 ENCOUNTER — Encounter: Payer: Self-pay | Admitting: Obstetrics and Gynecology

## 2017-12-05 VITALS — BP 100/62 | HR 69 | Ht 62.0 in | Wt 183.0 lb

## 2017-12-05 DIAGNOSIS — R102 Pelvic and perineal pain: Secondary | ICD-10-CM | POA: Diagnosis not present

## 2017-12-05 DIAGNOSIS — N809 Endometriosis, unspecified: Secondary | ICD-10-CM

## 2017-12-05 NOTE — Progress Notes (Signed)
Obstetrics & Gynecology Office Visit   Chief Complaint:  Chief Complaint  Patient presents with  . medication follow up    Norethindrone    History of Present Illness: 42 y.o. G2P2 presenting for medication follow up. The etiology of her pelvic pain is presumptive endometriosis.  She is currently being managed with NSAIDs and norethindrone.   The patient reports improvement in symptoms but continued pain intermittently.  On her current medication regimen the patient has achieved amenorrhea.   She has not noted any side-effects or new symptoms.  She has previously tried NSAIDs, narcotics and norethindrone.   Review of Systems: Review of Systems  Constitutional: Negative.   Gastrointestinal: Negative.   Neurological: Negative for headaches.     Past Medical History:  Past Medical History:  Diagnosis Date  . Allergy   . Asthma   . Chicken pox   . Endometriosis   . Obesity     Past Surgical History:  Past Surgical History:  Procedure Laterality Date  . BREAST SURGERY     breast biopsy left breast  . Pelvic adhesion surgery  2007  . TUBAL LIGATION Bilateral 2007    Gynecologic History: Patient's last menstrual period was 10/08/2017.  Obstetric History: G2P2  Family History:  Family History  Problem Relation Age of Onset  . Hypertension Mother   . Cancer Mother 67       one breast/tn; recurrence with a different type of breast cancer  . Stroke Mother   . Diabetes Father   . Hypertension Father   . Cancer Father 66       liver cancer  . Lung cancer Father   . Hyperlipidemia Father   . Stroke Father   . Asthma Daughter   . Cancer Maternal Grandmother        lung cancer  . Cancer Maternal Grandfather        lung cancer  . Diabetes Paternal Grandfather   . Hypertension Paternal Grandfather     Social History:  Social History   Socioeconomic History  . Marital status: Married    Spouse name: Not on file  . Number of children: Not on file  . Years  of education: Not on file  . Highest education level: Not on file  Occupational History  . Not on file  Social Needs  . Financial resource strain: Not on file  . Food insecurity:    Worry: Not on file    Inability: Not on file  . Transportation needs:    Medical: Not on file    Non-medical: Not on file  Tobacco Use  . Smoking status: Light Tobacco Smoker    Packs/day: 0.25    Years: 10.00    Pack years: 2.50    Types: Cigarettes  . Smokeless tobacco: Never Used  Substance and Sexual Activity  . Alcohol use: Yes    Comment: rare  . Drug use: No  . Sexual activity: Yes    Partners: Male    Birth control/protection: Surgical  Lifestyle  . Physical activity:    Days per week: 2 days    Minutes per session: 20 min  . Stress: Not at all  Relationships  . Social connections:    Talks on phone: More than three times a week    Gets together: Once a week    Attends religious service: Never    Active member of club or organization: No    Attends meetings of clubs or organizations:  Never    Relationship status: Married  . Intimate partner violence:    Fear of current or ex partner: No    Emotionally abused: No    Physically abused: No    Forced sexual activity: No  Other Topics Concern  . Not on file  Social History Narrative   Married    Works [employer]    2 kids     Allergies:  No Known Allergies  Medications: Prior to Admission medications   Medication Sig Start Date End Date Taking? Authorizing Provider  ibuprofen (ADVIL,MOTRIN) 800 MG tablet Take 800 mg by mouth every 8 (eight) hours as needed.   Yes [provider]  mupirocin ointment (BACTROBAN) 2 % Place 1 application into the nose 3 (three) times daily. Right toe 10/07/17  Yes McLean-Scocuzza, Pasty Spillers, MD  norethindrone (AYGESTIN) 5 MG tablet Take 1 tablet (5 mg total) by mouth daily. 09/07/17  Yes Vena Austria, MD  Vitamin D, Ergocalciferol, (DRISDOL) 50000 units CAPS capsule Take 1 capsule (50,000  Units total) by mouth every 7 (seven) days. 11/09/17  Yes McLean-Scocuzza, Pasty Spillers, MD    Physical Exam Vitals:  Vitals:   12/05/17 1616  BP: 100/62  Pulse: 69   Patient's last menstrual period was 10/08/2017.  General: NAD HEENT: normocephalic, anicteric Pulmonary: No increased work of breathing Extremities: no edema, erythema, or tenderness Neurologic: Grossly intact Psychiatric: mood appropriate, affect full  Female chaperone present for pelvic  portions of the physical exam  Assessment: 42 y.o. G2P2 medication follow up for presumptive endometriosis  Plan: Problem List Items Addressed This Visit      Other   Endometriosis    Other Visit Diagnoses    Pelvic pain    -  Primary     1) Endometriosis - doing well on norethindrone.  Discussed adding letrozole or gabapentin, switching to [place].  We also discussed that formal laparoscopic evaluation remains an option should any acute exacerbations occur as well.  2) A total of 10 minutes were spent in face-to-face contact with the patient during this encounter with over half of that time devoted to counseling and coordination of care.   Vena Austria, MD, [facility], [facility]

## 2017-12-12 ENCOUNTER — Telehealth: Payer: Self-pay

## 2017-12-12 NOTE — Telephone Encounter (Signed)
FYI

## 2017-12-12 NOTE — Telephone Encounter (Signed)
Copied from CRM [id number]. Topic: General - Other >> Dec 12, 2017  2:24 PM Oneal Grout wrote: Reason for CRM: [facility] is canceling order for genetic test due to patient not contacting

## 2017-12-12 NOTE — Telephone Encounter (Signed)
Advise the pt   TMS

## 2017-12-13 LAB — TSH: TSH: 1.71 u[IU]/mL (ref 0.450–4.500)

## 2017-12-13 LAB — HEPATIC FUNCTION PANEL
ALBUMIN: 4.2 g/dL (ref 3.5–5.5)
ALT: 25 IU/L (ref 0–32)
AST: 23 IU/L (ref 0–40)
Alkaline Phosphatase: 33 IU/L — ABNORMAL LOW (ref 39–117)
Bilirubin Total: 0.5 mg/dL (ref 0.0–1.2)
Bilirubin, Direct: 0.16 mg/dL (ref 0.00–0.40)
Total Protein: 7.2 g/dL (ref 6.0–8.5)

## 2017-12-13 LAB — BRCASSURE COMPREHENSIVE TEST

## 2017-12-13 LAB — VITAMIN D 25 HYDROXY (VIT D DEFICIENCY, FRACTURES): VIT D 25 HYDROXY: 6.9 ng/mL — AB (ref 30.0–100.0)

## 2017-12-13 LAB — LIPID PANEL W/O CHOL/HDL RATIO
Cholesterol, Total: 121 mg/dL (ref 100–199)
HDL: 27 mg/dL — AB (ref >39)
LDL Calculated: 74 mg/dL (ref 0–99)
TRIGLYCERIDES: 102 mg/dL (ref 0–149)
VLDL CHOLESTEROL CAL: 20 mg/dL (ref 5–40)

## 2017-12-13 LAB — T4, FREE: FREE T4: 1.4 ng/dL (ref 0.82–1.77)

## 2017-12-13 LAB — HEPATITIS B SURFACE ANTIBODY, QUANTITATIVE: Hepatitis B Surf Ab Quant: 4 m[IU]/mL — ABNORMAL LOW (ref >9.9)

## 2017-12-14 NOTE — Telephone Encounter (Signed)
Patient has been informed. She stated she will wait to discuss things at a later time.  Patient also said she was not contacted.

## 2017-12-21 ENCOUNTER — Other Ambulatory Visit: Payer: Self-pay | Admitting: Internal Medicine

## 2017-12-21 ENCOUNTER — Telehealth: Payer: Self-pay | Admitting: Internal Medicine

## 2017-12-21 DIAGNOSIS — Z1231 Encounter for screening mammogram for malignant neoplasm of breast: Secondary | ICD-10-CM

## 2017-12-21 NOTE — Telephone Encounter (Signed)
[place] needs a new mammo order the one that's in the orders will expire on 06/8 appt appt are avail after that date. Please advise? Thank you!

## 2018-01-13 ENCOUNTER — Encounter: Payer: Self-pay | Admitting: Internal Medicine

## 2018-01-13 ENCOUNTER — Ambulatory Visit: Payer: Managed Care, Other (non HMO) | Admitting: Internal Medicine

## 2018-01-13 VITALS — BP 118/64 | HR 58 | Temp 98.6°F | Ht 62.0 in | Wt 183.0 lb

## 2018-01-13 DIAGNOSIS — E669 Obesity, unspecified: Secondary | ICD-10-CM | POA: Diagnosis not present

## 2018-01-13 DIAGNOSIS — F419 Anxiety disorder, unspecified: Secondary | ICD-10-CM | POA: Diagnosis not present

## 2018-01-13 DIAGNOSIS — B373 Candidiasis of vulva and vagina: Secondary | ICD-10-CM | POA: Diagnosis not present

## 2018-01-13 DIAGNOSIS — E559 Vitamin D deficiency, unspecified: Secondary | ICD-10-CM

## 2018-01-13 DIAGNOSIS — N809 Endometriosis, unspecified: Secondary | ICD-10-CM

## 2018-01-13 DIAGNOSIS — B3731 Acute candidiasis of vulva and vagina: Secondary | ICD-10-CM

## 2018-01-13 DIAGNOSIS — Z23 Encounter for immunization: Secondary | ICD-10-CM | POA: Diagnosis not present

## 2018-01-13 DIAGNOSIS — Z803 Family history of malignant neoplasm of breast: Secondary | ICD-10-CM | POA: Diagnosis not present

## 2018-01-13 DIAGNOSIS — R51 Headache: Secondary | ICD-10-CM | POA: Diagnosis not present

## 2018-01-13 DIAGNOSIS — R519 Headache, unspecified: Secondary | ICD-10-CM

## 2018-01-13 DIAGNOSIS — F40243 Fear of flying: Secondary | ICD-10-CM

## 2018-01-13 MED ORDER — DIAZEPAM 5 MG PO TABS: 5.0000 mg | ORAL_TABLET | Freq: Two times a day (BID) | ORAL | 0 refills | Status: DC | PRN

## 2018-01-13 MED ORDER — FLUCONAZOLE 150 MG PO TABS: 150.0000 mg | ORAL_TABLET | Freq: Once | ORAL | 0 refills | Status: AC

## 2018-01-13 NOTE — Patient Instructions (Addendum)
Call and make sure Dr. Bonney Aid does pap  Call [facility] and schedule mammogram  Please sch hep B vaccine in 1 month and then in 6 months     Vaginal Yeast infection, Adult Vaginal yeast infection is a condition that causes soreness, swelling, and redness (inflammation) of the vagina. It also causes vaginal discharge. This is a common condition. Some women get this infection frequently. What are the causes? This condition is caused by a change in the normal balance of the yeast (candida) and bacteria that live in the vagina. This change causes an overgrowth of yeast, which causes the inflammation. What increases the risk? This condition is more likely to develop in:  Women who take antibiotic medicines.  Women who have diabetes.  Women who take birth control pills.  Women who are pregnant.  Women who douche often.  Women who have a weak defense (immune) system.  Women who have been taking steroid medicines for a long time.  Women who frequently wear tight clothing.  What are the signs or symptoms? Symptoms of this condition include:  White, thick vaginal discharge.  Swelling, itching, redness, and irritation of the vagina. The lips of the vagina (vulva) may be affected as well.  Pain or a burning feeling while urinating.  Pain during sex.  How is this diagnosed? This condition is diagnosed with a medical history and physical exam. This will include a pelvic exam. Your health care provider will examine a sample of your vaginal discharge under a microscope. Your health care provider may send this sample for testing to confirm the diagnosis. How is this treated? This condition is treated with medicine. Medicines may be over-the-counter or prescription. You may be told to use one or more of the following:  Medicine that is taken orally.  Medicine that is applied as a cream.  Medicine that is inserted directly into the vagina (suppository).  Follow these instructions at  home:  Take or apply over-the-counter and prescription medicines only as told by your health care provider.  Do not have sex until your health care provider has approved. Tell your sex partner that you have a yeast infection. That person should go to his or her health care provider if he or she develops symptoms.  Do not wear tight clothes, such as pantyhose or tight pants.  Avoid using tampons until your health care provider approves.  Eat more yogurt. This may help to keep your yeast infection from returning.  Try taking a sitz bath to help with discomfort. This is a warm water bath that is taken while you are sitting down. The water should only come up to your hips and should cover your buttocks. Do this 3-4 times per day or as told by your health care provider.  Do not douche.  Wear breathable, cotton underwear.  If you have diabetes, keep your blood sugar levels under control. Contact a health care provider if:  You have a fever.  Your symptoms go away and then return.  Your symptoms do not get better with treatment.  Your symptoms get worse.  You have new symptoms.  You develop blisters in or around your vagina.  You have blood coming from your vagina and it is not your menstrual period.  You develop pain in your abdomen. This information is not intended to replace advice given to you by your health care provider. Make sure you discuss any questions you have with your health care provider. Document Released: 04/28/2005 Document Revised: 12/31/2015  Document Reviewed: 01/20/2015 Elsevier Interactive Patient Education  2018 [employer]   Hepatitis B Vaccine, Recombinant injection What is this medicine? HEPATITIS B VACCINE (hep uh TAHY tis B VAK seen) is a vaccine. It is used to prevent an infection with the hepatitis B virus. This medicine may be used for other purposes; ask your health care provider or pharmacist if you have questions. COMMON BRAND NAME(S):  Engerix-B, Recombivax HB What should I tell my health care provider before I take this medicine? They need to know if you have any of these conditions: -fever, infection -heart disease -hepatitis B infection -immune system problems -kidney disease -an unusual or allergic reaction to vaccines, yeast, other medicines, foods, dyes, or preservatives -pregnant or trying to get pregnant -breast-feeding How should I use this medicine? This vaccine is for injection into a muscle. It is given by a health care professional. A copy of Vaccine Information Statements will be given before each vaccination. Read this sheet carefully each time. The sheet may change frequently. Talk to your pediatrician regarding the use of this medicine in children. While this drug may be prescribed for children as young as newborn for selected conditions, precautions do apply. Overdosage: If you think you have taken too much of this medicine contact a poison control center or emergency room at once. NOTE: This medicine is only for you. Do not share this medicine with others. What if I miss a dose? It is important not to miss your dose. Call your doctor or health care professional if you are unable to keep an appointment. What may interact with this medicine? -medicines that suppress your immune function like adalimumab, anakinra, infliximab -medicines to treat cancer -steroid medicines like prednisone or cortisone This list may not describe all possible interactions. Give your health care provider a list of all the medicines, herbs, non-prescription drugs, or dietary supplements you use. Also tell them if you smoke, drink alcohol, or use illegal drugs. Some items may interact with your medicine. What should I watch for while using this medicine? See your health care provider for all shots of this vaccine as directed. You must have 3 shots of this vaccine for protection from hepatitis B infection. Tell your doctor right  away if you have any serious or unusual side effects after getting this vaccine. What side effects may I notice from receiving this medicine? Side effects that you should report to your doctor or health care professional as soon as possible: -allergic reactions like skin rash, itching or hives, swelling of the face, lips, or tongue -breathing problems -confused, irritated -fast, irregular heartbeat -flu-like syndrome -numb, tingling pain -seizures -unusually weak or tired Side effects that usually do not require medical attention (report to your doctor or health care professional if they continue or are bothersome): -diarrhea -fever -headache -loss of appetite -muscle pain -nausea -pain, redness, swelling, or irritation at site where injected -tiredness This list may not describe all possible side effects. Call your doctor for medical advice about side effects. You may report side effects to FDA at 1-800-FDA-1088. Where should I keep my medicine? This drug is given in a hospital or clinic and will not be stored at home. NOTE: This sheet is a summary. It may not cover all possible information. If you have questions about this medicine, talk to your doctor, pharmacist, or health care provider.  2018 Elsevier/Gold Standard (2013-11-19 13:26:01)

## 2018-01-13 NOTE — Progress Notes (Signed)
Chief Complaint  Patient presents with  . Follow-up   F/u  1. Tobacco abuse still smoking cigarettes  2. Obesity w/o wt loss she reports eating sweets at work and soda and coffee but has cut back on soda and coffee no physical exercise outsideof work  3. Endometriosis she does not have menstrual cycles with the Aygestin pill and it is helping sx's but she does feel buttocks pain/pressure around the time of menses would normally be but pain not as back  4. Reviewed labs vit D def she is taking D3 weekly 5. Work is stressful with new changes but she is sleeping more.  6. H/a reduced frequency and sleeping more and reduced caffeine intake  7. Anxiety with flying and going to [facility] in 05/2017 with family and would like temp supply of Valium for flying as prior PCP rx.  8. She changed body washes and has vaginal itching and discharge and tried OTC monistat w/o relief    Review of Systems  Constitutional: Negative for weight loss.  HENT: Negative for hearing loss.   Eyes: Negative for blurred vision.  Respiratory: Negative for shortness of breath.   Cardiovascular: Negative for chest pain.  Gastrointestinal: Negative for abdominal pain.  Genitourinary:       +endometriosis pain improved   Skin: Negative for rash.  Neurological: Positive for headaches.  Psychiatric/Behavioral: The patient does not have insomnia.        +stress    Past Medical History:  Diagnosis Date  . Allergy   . Asthma   . Chicken pox   . Endometriosis   . Obesity    Past Surgical History:  Procedure Laterality Date  . BREAST SURGERY     breast biopsy left breast  . Pelvic adhesion surgery  2007  . TUBAL LIGATION Bilateral 2007   Family History  Problem Relation Age of Onset  . Hypertension Mother   . Cancer Mother 41       one breast/tn; recurrence with a different type of breast cancer  . Stroke Mother   . Diabetes Father   . Hypertension Father   . Cancer Father 56       liver cancer  . Lung  cancer Father   . Hyperlipidemia Father   . Stroke Father   . Asthma Daughter   . Cancer Maternal Grandmother        lung cancer  . Cancer Maternal Grandfather        lung cancer  . Diabetes Paternal Grandfather   . Hypertension Paternal Grandfather    Social History   Socioeconomic History  . Marital status: Married    Spouse name: Not on file  . Number of children: Not on file  . Years of education: Not on file  . Highest education level: Not on file  Occupational History  . Not on file  Social Needs  . Financial resource strain: Not on file  . Food insecurity:    Worry: Not on file    Inability: Not on file  . Transportation needs:    Medical: Not on file    Non-medical: Not on file  Tobacco Use  . Smoking status: Light Tobacco Smoker    Packs/day: 0.25    Years: 10.00    Pack years: 2.50    Types: Cigarettes  . Smokeless tobacco: Never Used  Substance and Sexual Activity  . Alcohol use: Yes    Comment: rare  . Drug use: No  . Sexual activity: Yes  Partners: Male    Birth control/protection: Surgical  Lifestyle  . Physical activity:    Days per week: 2 days    Minutes per session: 20 min  . Stress: Not at all  Relationships  . Social connections:    Talks on phone: More than three times a week    Gets together: Once a week    Attends religious service: Never    Active member of club or organization: No    Attends meetings of clubs or organizations: Never    Relationship status: Married  . Intimate partner violence:    Fear of current or ex partner: No    Emotionally abused: No    Physically abused: No    Forced sexual activity: No  Other Topics Concern  . Not on file  Social History Narrative   Married    Works [employer]    2 kids    Current Meds  Medication Sig  . ibuprofen (ADVIL,MOTRIN) 800 MG tablet Take 800 mg by mouth every 8 (eight) hours as needed.  . norethindrone (AYGESTIN) 5 MG tablet Take 1 tablet (5 mg total) by mouth daily.  .  Vitamin D, Ergocalciferol, (DRISDOL) 50000 units CAPS capsule Take 1 capsule (50,000 Units total) by mouth every 7 (seven) days.   No Known Allergies Recent Results (from the past 2160 hour(s))  Lipid Panel w/o Chol/HDL Ratio     Status: Abnormal   Collection Time: 10/28/17  9:28 AM  Result Value Ref Range   Cholesterol, Total 121 100 - 199 mg/dL   Triglycerides 562 0 - 149 mg/dL   HDL 27 (L) >13 mg/dL   VLDL Cholesterol Cal 20 5 - 40 mg/dL   LDL Calculated 74 0 - 99 mg/dL  Hepatic function panel     Status: Abnormal   Collection Time: 10/28/17  9:28 AM  Result Value Ref Range   Total Protein 7.2 6.0 - 8.5 g/dL   Albumin 4.2 3.5 - 5.5 g/dL   Bilirubin Total 0.5 0.0 - 1.2 mg/dL   Bilirubin, Direct 0.86 0.00 - 0.40 mg/dL   Alkaline Phosphatase 33 (L) 39 - 117 IU/L   AST 23 0 - 40 IU/L   ALT 25 0 - 32 IU/L  BRCAssure Comprehensive Test     Status: None   Collection Time: 10/28/17  9:28 AM  Result Value Ref Range   PREAUTHORIZATION CANCELED     Comment: Preauthorization complete. Testing cancelled at client or patient request.  Result canceled by the ancillary.    EXTRACTION Comment     Comment: This sample has been received and DNA extraction has been performed.  T4, free     Status: None   Collection Time: 10/28/17  9:28 AM  Result Value Ref Range   Free T4 1.40 0.82 - 1.77 ng/dL  TSH     Status: None   Collection Time: 10/28/17  9:28 AM  Result Value Ref Range   TSH 1.710 0.450 - 4.500 uIU/mL  Hepatitis B surface antibody     Status: Abnormal   Collection Time: 10/28/17  9:28 AM  Result Value Ref Range   Hepatitis B Surf Ab Quant 4.0 (L) Immunity>9.9 mIU/mL    Comment:   Status of Immunity                     Anti-HBs Level   ------------------                     --------------  Inconsistent with Immunity                   0.0 - 9.9 Consistent with Immunity                          >9.9   VITAMIN D 25 Hydroxy (Vit-D Deficiency, Fractures)     Status: Abnormal    Collection Time: 10/28/17  9:28 AM  Result Value Ref Range   Vit D, 25-Hydroxy 6.9 (L) 30.0 - 100.0 ng/mL    Comment: Vitamin D deficiency has been defined by the Institute of Medicine and an Endocrine Society practice guideline as a level of serum 25-OH vitamin D less than 20 ng/mL (1,2). The Endocrine Society went on to further define vitamin D insufficiency as a level between 21 and 29 ng/mL (2). 1. IOM (Institute of Medicine). 2010. Dietary reference    intakes for calcium and D. [place]: The    [employer]. 2. Holick MF, Binkley [place], Bischoff-Ferrari HA, et al.    Evaluation, treatment, and prevention of vitamin D    deficiency: an Endocrine Society clinical practice    guideline. JCEM. 2011 Jul; 96(7):1911-30.    Objective  Body mass index is 33.47 kg/m. Wt Readings from Last 3 Encounters:  01/13/18 183 lb (83 kg)  12/05/17 183 lb (83 kg)  10/07/17 178 lb 9.6 oz (81 kg)   Temp Readings from Last 3 Encounters:  01/13/18 98.6 F (37 C) (Oral)  10/07/17 98.6 F (37 C) (Oral)  09/04/17 98.9 F (37.2 C) (Oral)   BP Readings from Last 3 Encounters:  01/13/18 118/64  12/05/17 100/62  10/07/17 104/86   Pulse Readings from Last 3 Encounters:  01/13/18 (!) 58  12/05/17 69  10/07/17 62    Physical Exam  Constitutional: She is oriented to person, place, and time. Vital signs are normal. She appears well-developed and well-nourished. She is cooperative.  HENT:  Head: Normocephalic and atraumatic.  Mouth/Throat: Oropharynx is clear and moist and mucous membranes are normal.  Eyes: Pupils are equal, round, and reactive to light. Conjunctivae are normal.  Cardiovascular: Normal rate, regular rhythm and normal heart sounds.  Pulmonary/Chest: Effort normal and breath sounds normal.  Neurological: She is alert and oriented to person, place, and time. Gait normal.  Skin: Skin is warm, dry and intact.  Psychiatric: She has a normal mood and affect. Her speech  is normal and behavior is normal. Judgment and thought content normal. Cognition and memory are normal.  Nursing note and vitals reviewed.   Assessment   1. Obesity bmi 33.47  2. Tobacco abuse  3. Endometriosis sxs improved  4. Vit D def  5. H/a w/o etiology mild  6. Anxiety with flying  7. HM  8. Yeast vaginitis   Plan   1. rec healthy diet choices and exercise  2. Rec cessation  3. F/u OB/GYN Dr. Bonney AidStaebler and needs pap at f/u overdue  4. Cont D3 weekly  5. Prn otc meds I.e tylenol  6. Rx valium lower dose 5 mg bid prn flying #4 no FH  7.  Declines flu  Tdap utd Given hep b vaccine today 1/3  Encouraged pt to call and sch pap and mammogram  -if she wants genetic testing due to FH breast cancer in future needs counseling 1st (genetics) to get [facility] to pay for it  8. Diflucan x 1  Provider: Dr. French Anaracy [facility]Lean-Scoc[facility]

## 2018-01-13 NOTE — Progress Notes (Signed)
Pre visit review using our clinic review tool, if applicable. No additional management support is needed unless otherwise documented below in the visit note. 

## 2018-02-15 ENCOUNTER — Ambulatory Visit (INDEPENDENT_AMBULATORY_CARE_PROVIDER_SITE_OTHER): Payer: Managed Care, Other (non HMO) | Admitting: *Deleted

## 2018-02-15 DIAGNOSIS — Z23 Encounter for immunization: Secondary | ICD-10-CM | POA: Diagnosis not present

## 2018-02-15 NOTE — Progress Notes (Signed)
Patient tolerated well.

## 2018-02-21 ENCOUNTER — Telehealth: Payer: Self-pay | Admitting: Internal Medicine

## 2018-02-21 NOTE — Telephone Encounter (Signed)
I haven't had one since 2014. It's in my chart. I will call if you still need me to. Just let me know. I have an appointment with Dr. Bonney AidStaebler in Nov.    ----- Message -----  From: CMA Lennie OdorBrock T B  Sent: 02/20/18 9:33 AM  To: Peggy DadaFawn A Pierce  Subject:     Please call [facility] and get most recent pap results faxed here [phone]. Please.  -Donnelly StagerBrock,CMA   Brock this was for you to call not the patient   TMS

## 2018-04-26 ENCOUNTER — Other Ambulatory Visit: Payer: Self-pay | Admitting: Internal Medicine

## 2018-04-26 DIAGNOSIS — E559 Vitamin D deficiency, unspecified: Secondary | ICD-10-CM

## 2018-04-26 MED ORDER — VITAMIN D (ERGOCALCIFEROL) 1.25 MG (50000 UNIT) PO CAPS: 50000.0000 [IU] | ORAL_CAPSULE | ORAL | 1 refills | Status: DC

## 2018-05-23 ENCOUNTER — Ambulatory Visit: Payer: Managed Care, Other (non HMO) | Admitting: Internal Medicine

## 2018-05-23 ENCOUNTER — Encounter: Payer: Self-pay | Admitting: Internal Medicine

## 2018-05-23 VITALS — BP 130/80 | HR 68 | Temp 98.6°F | Ht 62.0 in | Wt 183.0 lb

## 2018-05-23 DIAGNOSIS — E559 Vitamin D deficiency, unspecified: Secondary | ICD-10-CM

## 2018-05-23 DIAGNOSIS — E669 Obesity, unspecified: Secondary | ICD-10-CM | POA: Diagnosis not present

## 2018-05-23 DIAGNOSIS — H6690 Otitis media, unspecified, unspecified ear: Secondary | ICD-10-CM

## 2018-05-23 DIAGNOSIS — H6122 Impacted cerumen, left ear: Secondary | ICD-10-CM | POA: Diagnosis not present

## 2018-05-23 MED ORDER — PHENTERMINE HCL 37.5 MG PO TABS: 37.5000 mg | ORAL_TABLET | Freq: Every day | ORAL | 0 refills | Status: DC

## 2018-05-23 MED ORDER — VITAMIN D (ERGOCALCIFEROL) 1.25 MG (50000 UNIT) PO CAPS: 50000.0000 [IU] | ORAL_CAPSULE | ORAL | 1 refills | Status: DC

## 2018-05-23 MED ORDER — AMOXICILLIN-POT CLAVULANATE 875-125 MG PO TABS: 1.0000 | ORAL_TABLET | Freq: Two times a day (BID) | ORAL | 0 refills | Status: DC

## 2018-05-23 MED ORDER — NEOMYCIN-POLYMYXIN-HC 3.5-10000-1 OT SOLN: 4.0000 [drp] | Freq: Four times a day (QID) | OTIC | 0 refills | Status: DC

## 2018-05-23 MED ORDER — CARBAMIDE PEROXIDE 6.5 % OT SOLN: 5.0000 [drp] | Freq: Two times a day (BID) | OTIC | 2 refills | Status: DC

## 2018-05-23 NOTE — Patient Instructions (Addendum)
MAMMOGRAM CALL TODAY  Vitamin D3 5000 IU daily after you complete 6 months of prescription   Otitis Media, Adult Otitis media occurs when there is inflammation and fluid in the middle ear. Your middle ear is a part of the ear that contains bones for hearing as well as air that helps send sounds to your brain. What are the causes? This condition is caused by a blockage in the eustachian tube. This tube drains fluid from the ear to the back of the nose (nasopharynx). A blockage in this tube can be caused by an object or by swelling (edema) in the tube. Problems that can cause a blockage include:  A cold or other upper respiratory infection.  Allergies.  An irritant, such as tobacco smoke.  Enlarged adenoids. The adenoids are areas of soft tissue located high in the back of the throat, behind the nose and the roof of the mouth.  A mass in the nasopharynx.  Damage to the ear caused by pressure changes (barotrauma).  What are the signs or symptoms? Symptoms of this condition include:  Ear pain.  A fever.  Decreased hearing.  A headache.  Tiredness (lethargy).  Fluid leaking from the ear.  Ringing in the ear.  How is this diagnosed? This condition is diagnosed with a physical exam. During the exam your health care provider will use an instrument called an otoscope to look into your ear and check for redness, swelling, and fluid. He or she will also ask about your symptoms. Your health care provider may also order tests, such as:  A test to check the movement of the eardrum (pneumatic otoscopy). This test is done by squeezing a small amount of air into the ear.  A test that changes air pressure in the middle ear to check how well the eardrum moves and whether the eustachian tube is working (tympanogram).  How is this treated? This condition usually goes away on its own within 3-5 days. But if the condition is caused by a bacteria infection and does not go away own its own, or  keeps coming back, your health care provider may:  Prescribe antibiotic medicines to treat the infection.  Prescribe or recommend medicines to control pain.  Follow these instructions at home:  Take over-the-counter and prescription medicines only as told by your health care provider.  If you were prescribed an antibiotic medicine, take it as told by your health care provider. Do not stop taking the antibiotic even if you start to feel better.  Keep all follow-up visits as told by your health care provider. This is important. Contact a health care provider if:  You have bleeding from your nose.  There is a lump on your neck.  You are not getting better in 5 days.  You feel worse instead of better. Get help right away if:  You have severe pain that is not controlled with medicine.  You have swelling, redness, or pain around your ear.  You have stiffness in your neck.  A part of your face is paralyzed.  The bone behind your ear (mastoid) is tender when you touch it.  You develop a severe headache. Summary  Otitis media is redness, soreness, and swelling of the middle ear.  This condition usually goes away on its own within 3-5 days.  If the problem does not go away in 3-5 days, your health care provider may prescribe or recommend medicines to treat your symptoms.  If you were prescribed an antibiotic medicine,  take it as told by your health care provider. This information is not intended to replace advice given to you by your health care provider. Make sure you discuss any questions you have with your health care provider. Document Released: 04/23/2004 Document Revised: 07/09/2016 Document Reviewed: 07/09/2016 Elsevier Interactive Patient Education  2018 [employer].  Earwax Buildup, Adult The ears produce a substance called earwax that helps keep bacteria out of the ear and protects the skin in the ear canal. Occasionally, earwax can build up in the ear and cause  discomfort or hearing loss. What increases the risk? This condition is more likely to develop in people who:  Are female.  Are elderly.  Naturally produce more earwax.  Clean their ears often with cotton swabs.  Use earplugs often.  Use in-ear headphones often.  Wear hearing aids.  Have narrow ear canals.  Have earwax that is overly thick or sticky.  Have eczema.  Are dehydrated.  Have excess hair in the ear canal.  What are the signs or symptoms? Symptoms of this condition include:  Reduced or muffled hearing.  A feeling of fullness in the ear or feeling that the ear is plugged.  Fluid coming from the ear.  Ear pain.  Ear itch.  Ringing in the ear.  Coughing.  An obvious piece of earwax that can be seen inside the ear canal.  How is this diagnosed? This condition may be diagnosed based on:  Your symptoms.  Your medical history.  An ear exam. During the exam, your health care provider will look into your ear with an instrument called an otoscope.  You may have tests, including a hearing test. How is this treated? This condition may be treated by:  Using ear drops to soften the earwax.  Having the earwax removed by a health care provider. The health care provider may: ? Flush the ear with water. ? Use an instrument that has a loop on the end (curette). ? Use a suction device.  Surgery to remove the wax buildup. This may be done in severe cases.  Follow these instructions at home:  Take over-the-counter and prescription medicines only as told by your health care provider.  Do not put any objects, including cotton swabs, into your ear. You can clean the opening of your ear canal with a washcloth or facial tissue.  Follow instructions from your health care provider about cleaning your ears. Do not over-clean your ears.  Drink enough fluid to keep your urine clear or pale yellow. This will help to thin the earwax.  Keep all follow-up visits as  told by your health care provider. If earwax builds up in your ears often or if you use hearing aids, consider seeing your health care provider for routine, preventive ear cleanings. Ask your health care provider how often you should schedule your cleanings.  If you have hearing aids, clean them according to instructions from the manufacturer and your health care provider. Contact a health care provider if:  You have ear pain.  You develop a fever.  You have blood, pus, or other fluid coming from your ear.  You have hearing loss.  You have ringing in your ears that does not go away.  Your symptoms do not improve with treatment.  You feel like the room is spinning (vertigo). Summary  Earwax can build up in the ear and cause discomfort or hearing loss.  The most common symptoms of this condition include reduced or muffled hearing and a  feeling of fullness in the ear or feeling that the ear is plugged.  This condition may be diagnosed based on your symptoms, your medical history, and an ear exam.  This condition may be treated by using ear drops to soften the earwax or by having the earwax removed by a health care provider.  Do not put any objects, including cotton swabs, into your ear. You can clean the opening of your ear canal with a washcloth or facial tissue. This information is not intended to replace advice given to you by your health care provider. Make sure you discuss any questions you have with your health care provider. Document Released: 08/26/2004 Document Revised: 09/29/2016 Document Reviewed: 09/29/2016 Elsevier Interactive Patient Education  [employer].

## 2018-05-23 NOTE — Progress Notes (Signed)
Chief Complaint  Patient presents with  . Ear Pain   F/u  1. Needs work related form filled out for tobacco and obesity [facility]  2. C/o b/l ear pain L>R and ear itching she possibly has allergies (grass, hay fever, pollen) and is congested in the am. Ear is sore w/o fever. Tried claritin and cant tolerate flonase 3. Vit D def will cont 50K weekly D3    Review of Systems  Constitutional: Negative for weight loss.  HENT: Positive for ear pain. Negative for hearing loss.   Eyes: Negative for blurred vision.  Respiratory: Negative for shortness of breath.   Cardiovascular: Negative for chest pain.  Skin: Negative for rash.  Neurological: Negative for headaches.  Psychiatric/Behavioral: Negative for depression.   Past Medical History:  Diagnosis Date  . Allergy   . Asthma   . Chicken pox   . Endometriosis   . Obesity   . Vitamin D deficiency    Past Surgical History:  Procedure Laterality Date  . BREAST SURGERY     breast biopsy left breast  . Pelvic adhesion surgery  2007  . TUBAL LIGATION Bilateral 2007   Family History  Problem Relation Age of Onset  . Hypertension Mother   . Cancer Mother 15       one breast/tn; recurrence with a different type of breast cancer  . Stroke Mother   . Diabetes Father   . Hypertension Father   . Cancer Father 82       liver cancer  . Lung cancer Father   . Hyperlipidemia Father   . Stroke Father   . Asthma Daughter   . Cancer Maternal Grandmother        lung cancer  . Cancer Maternal Grandfather        lung cancer  . Diabetes Paternal Grandfather   . Hypertension Paternal Grandfather    Social History   Socioeconomic History  . Marital status: Married    Spouse name: Not on file  . Number of children: Not on file  . Years of education: Not on file  . Highest education level: Not on file  Occupational History  . Not on file  Social Needs  . Financial resource strain: Not on file  . Food insecurity:    Worry: Not on file     Inability: Not on file  . Transportation needs:    Medical: Not on file    Non-medical: Not on file  Tobacco Use  . Smoking status: Light Tobacco Smoker    Packs/day: 0.25    Years: 10.00    Pack years: 2.50    Types: Cigarettes  . Smokeless tobacco: Never Used  Substance and Sexual Activity  . Alcohol use: Yes    Comment: rare  . Drug use: No  . Sexual activity: Yes    Partners: Male    Birth control/protection: Surgical  Lifestyle  . Physical activity:    Days per week: 2 days    Minutes per session: 20 min  . Stress: Not at all  Relationships  . Social connections:    Talks on phone: More than three times a week    Gets together: Once a week    Attends religious service: Never    Active member of club or organization: No    Attends meetings of clubs or organizations: Never    Relationship status: Married  . Intimate partner violence:    Fear of current or ex partner: No  Emotionally abused: No    Physically abused: No    Forced sexual activity: No  Other Topics Concern  . Not on file  Social History Narrative   Married    Works [employer]    2 kids 40 y.o and almost 40 y.o daughters as of 01/13/18    Current Meds  Medication Sig  . diazepam (VALIUM) 5 MG tablet Take 1 tablet (5 mg total) by mouth 2 (two) times daily as needed for anxiety.  Marland Kitchen ibuprofen (ADVIL,MOTRIN) 800 MG tablet Take 800 mg by mouth every 8 (eight) hours as needed.  . norethindrone (AYGESTIN) 5 MG tablet Take 1 tablet (5 mg total) by mouth daily.  . Vitamin D, Ergocalciferol, (DRISDOL) 50000 units CAPS capsule Take 1 capsule (50,000 Units total) by mouth every 7 (seven) days.   No Known Allergies No results found for this or any previous visit (from the past 2160 hour(s)). Objective  Body mass index is 33.47 kg/m. Wt Readings from Last 3 Encounters:  05/23/18 183 lb (83 kg)  01/13/18 183 lb (83 kg)  12/05/17 183 lb (83 kg)   Temp Readings from Last 3 Encounters:  05/23/18 98.6 F (37  C) (Oral)  01/13/18 98.6 F (37 C) (Oral)  10/07/17 98.6 F (37 C) (Oral)   BP Readings from Last 3 Encounters:  05/23/18 130/80  01/13/18 118/64  12/05/17 100/62   Pulse Readings from Last 3 Encounters:  05/23/18 68  01/13/18 (!) 58  12/05/17 69    Physical Exam  Constitutional: She is oriented to person, place, and time. Vital signs are normal. She appears well-developed and well-nourished. She is cooperative.  HENT:  Head: Normocephalic and atraumatic.  Mouth/Throat: Oropharynx is clear and moist and mucous membranes are normal.  Left ear with wax and irritation  B/l fluid ear s   Eyes: Pupils are equal, round, and reactive to light. Conjunctivae are normal.  Cardiovascular: Normal rate, regular rhythm and normal heart sounds.  Pulmonary/Chest: Effort normal and breath sounds normal.  Neurological: She is alert and oriented to person, place, and time. Gait normal.  Skin: Skin is warm, dry and intact.  Psychiatric: She has a normal mood and affect. Her speech is normal and behavior is normal. Judgment and thought content normal. Cognition and memory are normal.  Nursing note and vitals reviewed.   Assessment   1. Ear pain and itching likely 2/2 wax and irritation and OM 2. Vit D def  3. Obesity 33.47    4. HM Plan  1. Augmentin, neomycin drops then use debrox f/u prn  2. D3 50K weekly x 6 months then D3 5000 IU qd  3. adipex 37.5 x 2 months rec healthy diet choices and exercise  F/u in 2 months Filled out work related forms [facility] 4.  Declines flu  Tdap utd Given hep b vaccine 2/3 3rd due 07/15/18  Encouraged pt to call and sch pap and mammogram again today  -if she wants genetic testing due to FH breast cancer in future needs counseling 1st (genetics) to get [facility] to pay for it  Provider: Dr. French Ana McLean-Scocuzza-Internal Medicine

## 2018-05-23 NOTE — Progress Notes (Signed)
Pre visit review using our clinic review tool, if applicable. No additional management support is needed unless otherwise documented below in the visit note. 

## 2018-05-24 ENCOUNTER — Encounter: Payer: Self-pay | Admitting: Internal Medicine

## 2018-06-12 ENCOUNTER — Encounter: Payer: Self-pay | Admitting: Obstetrics and Gynecology

## 2018-06-12 ENCOUNTER — Ambulatory Visit (INDEPENDENT_AMBULATORY_CARE_PROVIDER_SITE_OTHER): Payer: Managed Care, Other (non HMO) | Admitting: Obstetrics and Gynecology

## 2018-06-12 VITALS — BP 120/78 | HR 78 | Wt 184.0 lb

## 2018-06-12 DIAGNOSIS — N809 Endometriosis, unspecified: Secondary | ICD-10-CM | POA: Diagnosis not present

## 2018-06-12 NOTE — Progress Notes (Signed)
Obstetrics & Gynecology Office Visit   Chief Complaint:  Chief Complaint  Patient presents with  . Medication follow up    History of Present Illness: 42 y.o. G2P2 presenting for medication follow up. The etiology of her pelvic pain is presumptive endometriosis.  She is currently being managed with NSAIDs and norethindrone.   The patient reports good control of symptoms on her current regimen.  On her current medication regimen the patient has achieved amenorrhea.   She has not noted any side-effects or new symptoms.  She has previously tried NSAIDs, norethindrone and continous OCP.   Review of Systems: Review of Systems  Constitutional: Negative.   Gastrointestinal: Negative.   Genitourinary: Negative.      Past Medical History:  Past Medical History:  Diagnosis Date  . Allergy   . Asthma   . Chicken pox   . Endometriosis   . Obesity   . Vitamin D deficiency     Past Surgical History:  Past Surgical History:  Procedure Laterality Date  . BREAST SURGERY     breast biopsy left breast  . Pelvic adhesion surgery  2007  . TUBAL LIGATION Bilateral 2007    Gynecologic History: No LMP recorded.  Obstetric History: G2P2  Family History:  Family History  Problem Relation Age of Onset  . Hypertension Mother   . Cancer Mother 28       one breast/tn; recurrence with a different type of breast cancer  . Stroke Mother   . Diabetes Father   . Hypertension Father   . Cancer Father 75       liver cancer  . Lung cancer Father   . Hyperlipidemia Father   . Stroke Father   . Asthma Daughter   . Cancer Maternal Grandmother        lung cancer  . Cancer Maternal Grandfather        lung cancer  . Diabetes Paternal Grandfather   . Hypertension Paternal Grandfather     Social History:  Social History   Socioeconomic History  . Marital status: Married    Spouse name: Not on file  . Number of children: Not on file  . Years of education: Not on file  . Highest  education level: Not on file  Occupational History  . Not on file  Social Needs  . Financial resource strain: Not on file  . Food insecurity:    Worry: Not on file    Inability: Not on file  . Transportation needs:    Medical: Not on file    Non-medical: Not on file  Tobacco Use  . Smoking status: Light Tobacco Smoker    Packs/day: 0.25    Years: 10.00    Pack years: 2.50    Types: Cigarettes  . Smokeless tobacco: Never Used  Substance and Sexual Activity  . Alcohol use: Yes    Comment: rare  . Drug use: No  . Sexual activity: Yes    Partners: Male    Birth control/protection: Surgical  Lifestyle  . Physical activity:    Days per week: 2 days    Minutes per session: 20 min  . Stress: Not at all  Relationships  . Social connections:    Talks on phone: More than three times a week    Gets together: Once a week    Attends religious service: Never    Active member of club or organization: No    Attends meetings of clubs or organizations:  Never    Relationship status: Married  . Intimate partner violence:    Fear of current or ex partner: No    Emotionally abused: No    Physically abused: No    Forced sexual activity: No  Other Topics Concern  . Not on file  Social History Narrative   Married    Works [employer]    2 kids 36 y.o and almost 54 y.o daughters as of 01/13/18     Allergies:  No Known Allergies  Medications: Prior to Admission medications   Medication Sig Start Date End Date Taking? Authorizing Provider  amoxicillin-clavulanate (AUGMENTIN) 875-125 MG tablet Take 1 tablet by mouth 2 (two) times daily. With food 05/23/18  Yes McLean-Scocuzza, Pasty Spillers, MD  carbamide peroxide (DEBROX) 6.5 % OTIC solution Place 5 drops into the left ear 2 (two) times daily. To left ear in 1 week 05/23/18  Yes McLean-Scocuzza, Pasty Spillers, MD  diazepam (VALIUM) 5 MG tablet Take 1 tablet (5 mg total) by mouth 2 (two) times daily as needed for anxiety. 01/13/18  Yes McLean-Scocuzza,  Pasty Spillers, MD  ibuprofen (ADVIL,MOTRIN) 800 MG tablet Take 800 mg by mouth every 8 (eight) hours as needed.   Yes [provider]  neomycin-polymyxin-hydrocortisone (CORTISPORIN) OTIC solution Place 4 drops into both ears 4 (four) times daily. X 7-10 days 05/23/18  Yes McLean-Scocuzza, Pasty Spillers, MD  norethindrone (AYGESTIN) 5 MG tablet Take 1 tablet (5 mg total) by mouth daily. 09/07/17  Yes Vena Austria, MD  phentermine (ADIPEX-P) 37.5 MG tablet Take 1 tablet (37.5 mg total) by mouth daily before breakfast. 05/23/18  Yes McLean-Scocuzza, Pasty Spillers, MD  Vitamin D, Ergocalciferol, (DRISDOL) 50000 units CAPS capsule Take 1 capsule (50,000 Units total) by mouth every 7 (seven) days. 05/23/18  Yes McLean-Scocuzza, Pasty Spillers, MD    Physical Exam Vitals:  Vitals:   06/12/18 1632  BP: 120/78  Pulse: 78   No LMP recorded.  General: NAD HEENT: normocephalic, anicteric Pulmonary: No increased work of breathing  Extremities: no edema, erythema, or tenderness Neurologic: Grossly intact Psychiatric: mood appropriate, affect full  Female chaperone present for pelvic  portions of the physical exam  Assessment: 42 y.o. G2P2 follow up presumptive endometriosis  Plan: Problem List Items Addressed This Visit      Other   Endometriosis - Primary     - good improvement in symptoms on norethindrone - continue at current dose - discussed management option for any acute flare ups - discussed Mirena IUD as long term management option - A total of 15 minutes were spent in face-to-face contact with the patient during this encounter with over half of that time devoted to counseling and coordination of care. - Return in about 6 months (around 12/11/2018) for annual.   Vena Austria, MD, [facility], [facility] 06/13/2018, 9:12 AM

## 2018-06-14 ENCOUNTER — Other Ambulatory Visit: Payer: Self-pay | Admitting: Internal Medicine

## 2018-06-14 ENCOUNTER — Ambulatory Visit
Admission: RE | Admit: 2018-06-14 | Discharge: 2018-06-14 | Disposition: A | Discharge status: Home / Self Care | Payer: Managed Care, Other (non HMO) | Source: Ambulatory Visit | Attending: Internal Medicine | Admitting: Internal Medicine

## 2018-06-14 DIAGNOSIS — R928 Other abnormal and inconclusive findings on diagnostic imaging of breast: Secondary | ICD-10-CM

## 2018-06-14 DIAGNOSIS — N6320 Unspecified lump in the left breast, unspecified quadrant: Secondary | ICD-10-CM

## 2018-06-14 DIAGNOSIS — Z1231 Encounter for screening mammogram for malignant neoplasm of breast: Secondary | ICD-10-CM

## 2018-06-15 ENCOUNTER — Encounter: Payer: Self-pay | Admitting: *Deleted

## 2018-06-21 ENCOUNTER — Ambulatory Visit
Admission: RE | Admit: 2018-06-21 | Discharge: 2018-06-21 | Disposition: A | Discharge status: Home / Self Care | Payer: Managed Care, Other (non HMO) | Source: Ambulatory Visit | Attending: Internal Medicine | Admitting: Internal Medicine

## 2018-06-21 DIAGNOSIS — N6320 Unspecified lump in the left breast, unspecified quadrant: Secondary | ICD-10-CM

## 2018-06-21 DIAGNOSIS — R928 Other abnormal and inconclusive findings on diagnostic imaging of breast: Secondary | ICD-10-CM | POA: Diagnosis not present

## 2018-07-21 ENCOUNTER — Ambulatory Visit: Payer: Managed Care, Other (non HMO) | Admitting: Internal Medicine

## 2018-07-21 ENCOUNTER — Encounter: Payer: Self-pay | Admitting: Internal Medicine

## 2018-07-21 VITALS — BP 108/58 | HR 61 | Temp 98.5°F | Ht 62.0 in | Wt 182.4 lb

## 2018-07-21 DIAGNOSIS — Z23 Encounter for immunization: Secondary | ICD-10-CM

## 2018-07-21 DIAGNOSIS — E559 Vitamin D deficiency, unspecified: Secondary | ICD-10-CM | POA: Diagnosis not present

## 2018-07-21 DIAGNOSIS — H6122 Impacted cerumen, left ear: Secondary | ICD-10-CM

## 2018-07-21 DIAGNOSIS — E669 Obesity, unspecified: Secondary | ICD-10-CM

## 2018-07-21 DIAGNOSIS — B373 Candidiasis of vulva and vagina: Secondary | ICD-10-CM

## 2018-07-21 DIAGNOSIS — B3731 Acute candidiasis of vulva and vagina: Secondary | ICD-10-CM

## 2018-07-21 MED ORDER — FLUCONAZOLE 150 MG PO TABS: 150.0000 mg | ORAL_TABLET | Freq: Once | ORAL | 0 refills | Status: AC

## 2018-07-21 NOTE — Progress Notes (Signed)
Chief Complaint  Patient presents with  . Follow-up   F/u  1. Ear pain resolved but wax in ears today  2. Obesity 33.36 never took adipex pills  3. C/w yeast infection after antibiotics and would like yeast pill    Review of Systems  Constitutional: Negative for weight loss.  HENT: Negative for ear pain and hearing loss.        +ear wax   Eyes: Negative for blurred vision.  Respiratory: Negative for shortness of breath.   Cardiovascular: Negative for chest pain.  Gastrointestinal: Negative for abdominal pain.  Genitourinary:       +vaginal discharge    Musculoskeletal: Negative for falls.  Skin: Negative for rash.  Neurological: Negative for headaches.  Psychiatric/Behavioral: Negative for depression.   Past Medical History:  Diagnosis Date  . Allergy   . Asthma   . Chicken pox   . Endometriosis   . Obesity   . Vitamin D deficiency    Past Surgical History:  Procedure Laterality Date  . 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 BREAST SURGERY     breast biopsy left breast  . Pelvic adhesion surgery  2007  . TUBAL LIGATION Bilateral 2007   Family History  Problem Relation Age of Onset  . Hypertension Mother   . Cancer Mother 43       one breast/tn; recurrence with a different type of breast cancer  . Stroke Mother   . Breast cancer Mother   . Diabetes Father   . Hypertension Father   . Cancer Father 47       liver cancer  . Lung cancer Father   . Hyperlipidemia Father   . Stroke Father   . Asthma Daughter   . Cancer Maternal Grandmother        lung cancer  . Cancer Maternal Grandfather        lung cancer  . Diabetes Paternal Grandfather   . Hypertension Paternal Grandfather    Social History   Socioeconomic History  . Marital status: Married    Spouse name: Not on file  . Number of children: Not on file  . Years of education: Not on file  . Highest education level: Not on file  Occupational History  . Not on file  Social  Needs  . Financial resource strain: Not on file  . Food insecurity:    Worry: Not on file    Inability: Not on file  . Transportation needs:    Medical: Not on file    Non-medical: Not on file  Tobacco Use  . Smoking status: Light Tobacco Smoker    Packs/day: 0.25    Years: 10.00    Pack years: 2.50    Types: Cigarettes  . Smokeless tobacco: Never Used  Substance and Sexual Activity  . Alcohol use: Yes    Comment: rare  . Drug use: No  . Sexual activity: Yes    Partners: Male    Birth control/protection: Surgical  Lifestyle  . Physical activity:    Days per week: 2 days    Minutes per session: 20 min  . Stress: Not at all  Relationships  . Social connections:    Talks on phone: More than three times a week    Gets together: Once a week    Attends religious service: Never    Active member of club or organization: No    Attends meetings of clubs or organizations: Never      Relationship status: Married  . Intimate partner violence:    Fear of current or ex partner: No    Emotionally abused: No    Physically abused: No    Forced sexual activity: No  Other Topics Concern  . Not on file  Social History Narrative   Married    Works [place]    2 kids 66 y.o and almost 23 y.o daughters as of 01/13/18    Current Meds  Medication Sig  . carbamide peroxide (DEBROX) 6.5 % OTIC solution Place 5 drops into the left ear 2 (two) times daily. To left ear in 1 week  . diazepam (VALIUM) 5 MG tablet Take 1 tablet (5 mg total) by mouth 2 (two) times daily as needed for anxiety.  Marland Kitchen ibuprofen (ADVIL,MOTRIN) 800 MG tablet Take 800 mg by mouth every 8 (eight) hours as needed.  . neomycin-polymyxin-hydrocortisone (CORTISPORIN) OTIC solution Place 4 drops into both ears 4 (four) times daily. X 7-10 days  . norethindrone (AYGESTIN) 5 MG tablet Take 1 tablet (5 mg total) by mouth daily.  . phentermine (ADIPEX-P) 37.5 MG tablet Take 1 tablet (37.5 mg total) by mouth daily before breakfast.  .  Vitamin D, Ergocalciferol, (DRISDOL) 50000 units CAPS capsule Take 1 capsule (50,000 Units total) by mouth every 7 (seven) days.   No Known Allergies No results found for this or any previous visit (from the past 2160 hour(s)). Objective  Body mass index is 33.36 kg/m. Wt Readings from Last 3 Encounters:  07/21/18 182 lb 6.4 oz (82.7 kg)  06/12/18 184 lb (83.5 kg)  05/23/18 183 lb (83 kg)   Temp Readings from Last 3 Encounters:  07/21/18 98.5 F (36.9 C) (Oral)  05/23/18 98.6 F (37 C) (Oral)  01/13/18 98.6 F (37 C) (Oral)   BP Readings from Last 3 Encounters:  07/21/18 (!) 108/58  06/12/18 120/78  05/23/18 130/80   Pulse Readings from Last 3 Encounters:  07/21/18 61  06/12/18 78  05/23/18 68    Physical Exam Vitals signs and nursing note reviewed.  Constitutional:      Appearance: Normal appearance. She is normal weight.  HENT:     Head: Normocephalic and atraumatic.     Ears:     Comments: Cerumen impaction left ear      Nose: Nose normal.     Mouth/Throat:     Mouth: Mucous membranes are moist.     Pharynx: Oropharynx is clear.  Eyes:     Conjunctiva/sclera: Conjunctivae normal.     Pupils: Pupils are equal, round, and reactive to light.  Cardiovascular:     Rate and Rhythm: Normal rate and regular rhythm.     Heart sounds: Normal heart sounds.  Pulmonary:     Effort: Pulmonary effort is normal.     Breath sounds: Normal breath sounds.  Abdominal:     General: Abdomen is flat.     Palpations: Abdomen is soft.  Skin:    General: Skin is warm and dry.  Neurological:     General: No focal deficit present.     Mental Status: She is alert and oriented to person, place, and time. Mental status is at baseline.     Gait: Gait normal.  Psychiatric:        Attention and Perception: Attention and perception normal.        Mood and Affect: Mood and affect normal.        Speech: Speech normal.  Behavior: Behavior normal. Behavior is cooperative.         Thought Content: Thought content normal.        Cognition and Memory: Cognition and memory normal.        Judgment: Judgment normal.     Assessment   1. Ear pain resolved now with ear itching likely 2/2 cerumen impaction left ear  2. Obesity bmi 33.36  3. Yeast vaginitis  4. Vit D def  5. HM  Plan   1. Ear lavage left ear with wax removed  2. Has not started 2 month supply of adipex yet since Rx but she has rx  3. Diflucan x 1 may repeat dose in 3 days if needed  4. Recheck vitamin D 10/29/18 on weekly D3 for now  5.  Declines flu  Tdap utd Given hep b vaccine 3/3 vaccines given today check hep b titer 10/29/18 given [facility] sheet for CMET, lipid, CBC, TSH, UA, vit D, hep B sAb, MMR given [facility] form today  [facility] [facility] due 11/2018 mammogram again 06/21/18 negative repeat except for cyst  -if she wants genetic testing due to [place] breast cancer in future needs counseling 1st (genetics) to get [facility] to pay for she will call back 1st of year   Provider: Dr. Olivia Mackie McLean-Scocuzza-Internal Medicine

## 2018-07-21 NOTE — Patient Instructions (Addendum)
Debrox ear wax drops 4-7 days 1x per month  Schedule fasting labs with [facility] 10/29/18 to be faxed to me  Happy Holidays    Earwax Buildup, Adult The ears produce a substance called earwax that helps keep bacteria out of the ear and protects the skin in the ear canal. Occasionally, earwax can build up in the ear and cause discomfort or hearing loss. What increases the risk? This condition is more likely to develop in people who:  Are female.  Are elderly.  Naturally produce more earwax.  Clean their ears often with cotton swabs.  Use earplugs often.  Use in-ear headphones often.  Wear hearing aids.  Have narrow ear canals.  Have earwax that is overly thick or sticky.  Have eczema.  Are dehydrated.  Have excess hair in the ear canal. What are the signs or symptoms? Symptoms of this condition include:  Reduced or muffled hearing.  A feeling of fullness in the ear or feeling that the ear is plugged.  Fluid coming from the ear.  Ear pain.  Ear itch.  Ringing in the ear.  Coughing.  An obvious piece of earwax that can be seen inside the ear canal. How is this diagnosed? This condition may be diagnosed based on:  Your symptoms.  Your medical history.  An ear exam. During the exam, your health care provider will look into your ear with an instrument called an otoscope. You may have tests, including a hearing test. How is this treated? This condition may be treated by:  Using ear drops to soften the earwax.  Having the earwax removed by a health care provider. The health care provider may: ? Flush the ear with water. ? Use an instrument that has a loop on the end (curette). ? Use a suction device.  Surgery to remove the wax buildup. This may be done in severe cases. Follow these instructions at home:   Take over-the-counter and prescription medicines only as told by your health care provider.  Do not put any objects, including cotton swabs, into your  ear. You can clean the opening of your ear canal with a washcloth or facial tissue.  Follow instructions from your health care provider about cleaning your ears. Do not over-clean your ears.  Drink enough fluid to keep your urine clear or pale yellow. This will help to thin the earwax.  Keep all follow-up visits as told by your health care provider. If earwax builds up in your ears often or if you use hearing aids, consider seeing your health care provider for routine, preventive ear cleanings. Ask your health care provider how often you should schedule your cleanings.  If you have hearing aids, clean them according to instructions from the manufacturer and your health care provider. Contact a health care provider if:  You have ear pain.  You develop a fever.  You have blood, pus, or other fluid coming from your ear.  You have hearing loss.  You have ringing in your ears that does not go away.  Your symptoms do not improve with treatment.  You feel like the room is spinning (vertigo). Summary  Earwax can build up in the ear and cause discomfort or hearing loss.  The most common symptoms of this condition include reduced or muffled hearing and a feeling of fullness in the ear or feeling that the ear is plugged.  This condition may be diagnosed based on your symptoms, your medical history, and an ear exam.  This condition  may be treated by using ear drops to soften the earwax or by having the earwax removed by a health care provider.  Do not put any objects, including cotton swabs, into your ear. You can clean the opening of your ear canal with a washcloth or facial tissue. This information is not intended to replace advice given to you by your health care provider. Make sure you discuss any questions you have with your health care provider. Document Released: 08/26/2004 Document Revised: 06/30/2017 Document Reviewed: 09/29/2016 Elsevier Interactive Patient Education  2019  [employer].

## 2018-07-21 NOTE — Progress Notes (Signed)
Pre visit review using our clinic review tool, if applicable. No additional management support is needed unless otherwise documented below in the visit note. 

## 2018-09-26 ENCOUNTER — Other Ambulatory Visit: Payer: Self-pay | Admitting: Obstetrics and Gynecology

## 2018-09-26 NOTE — Telephone Encounter (Signed)
Needs annual in 12/2018

## 2019-01-26 ENCOUNTER — Ambulatory Visit: Payer: Managed Care, Other (non HMO) | Admitting: Internal Medicine

## 2019-03-27 ENCOUNTER — Other Ambulatory Visit: Payer: Self-pay | Admitting: Obstetrics and Gynecology

## 2019-03-27 ENCOUNTER — Other Ambulatory Visit: Payer: Self-pay

## 2019-03-27 MED ORDER — NORETHINDRONE ACETATE 5 MG PO TABS: 5.0000 mg | ORAL_TABLET | Freq: Every day | ORAL | 0 refills | Status: DC

## 2019-03-27 NOTE — Telephone Encounter (Signed)
Patient has 1 wk of pills left. She is scheduled w/[facility] for AE on 04/17/2019. Requesting refill to get to apt. Cb#[phone]

## 2019-03-27 NOTE — Telephone Encounter (Signed)
Refill sent.  Pt aware. 

## 2019-04-08 IMAGING — MG DIGITAL DIAGNOSTIC UNILATERAL LEFT MAMMOGRAM WITH TOMO AND CAD
4 series · 4 of 12 positions shown · non-contrast
Comparison: Previous exam(s).

CLINICAL DATA: 42-year-old female recalled from new baseline
screening mammogram dated 06/14/2018 for a possible left breast
mass. Patient has history of a left breast biopsy demonstrating a
fibroadenoma at an outside institution.

EXAM:
DIGITAL DIAGNOSTIC LEFT MAMMOGRAM WITH CAD AND TOMO
ULTRASOUND LEFT BREAST

[L CC synth-2D]
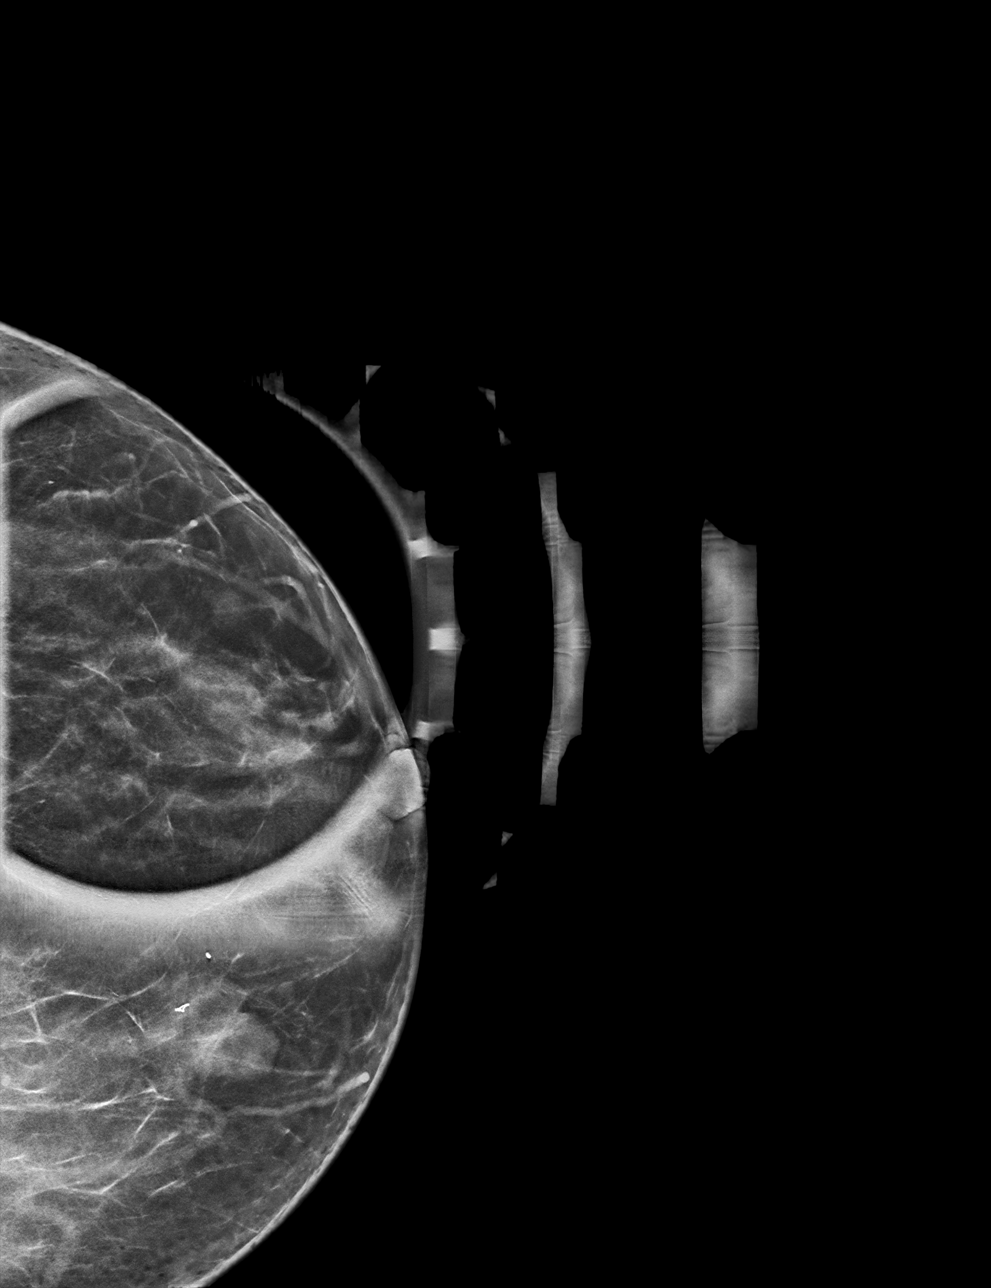

[L MLO synth-2D]
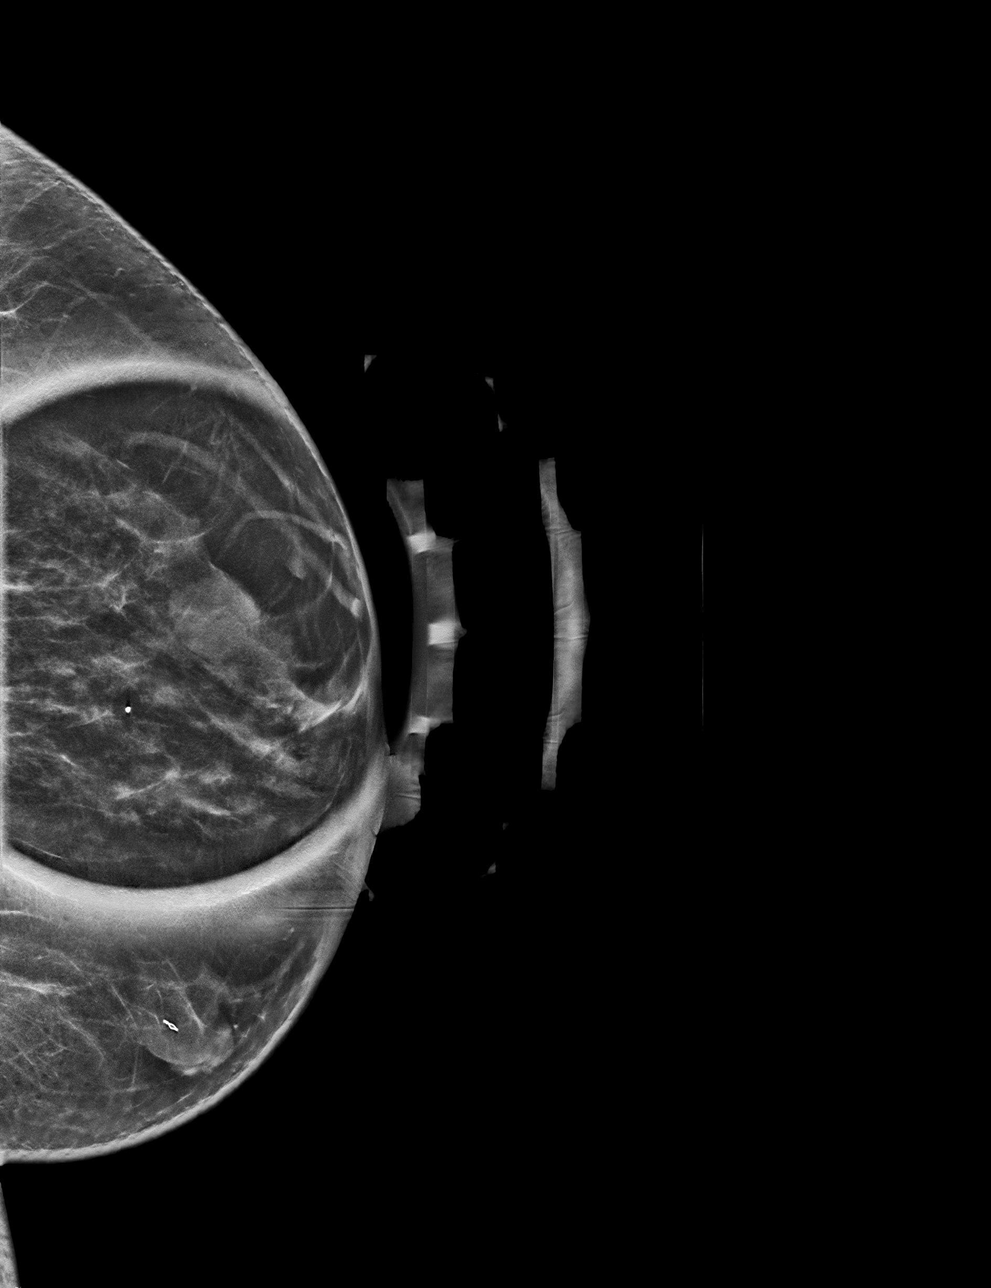

[L MLO tomo · tomo slice 31/60.0]
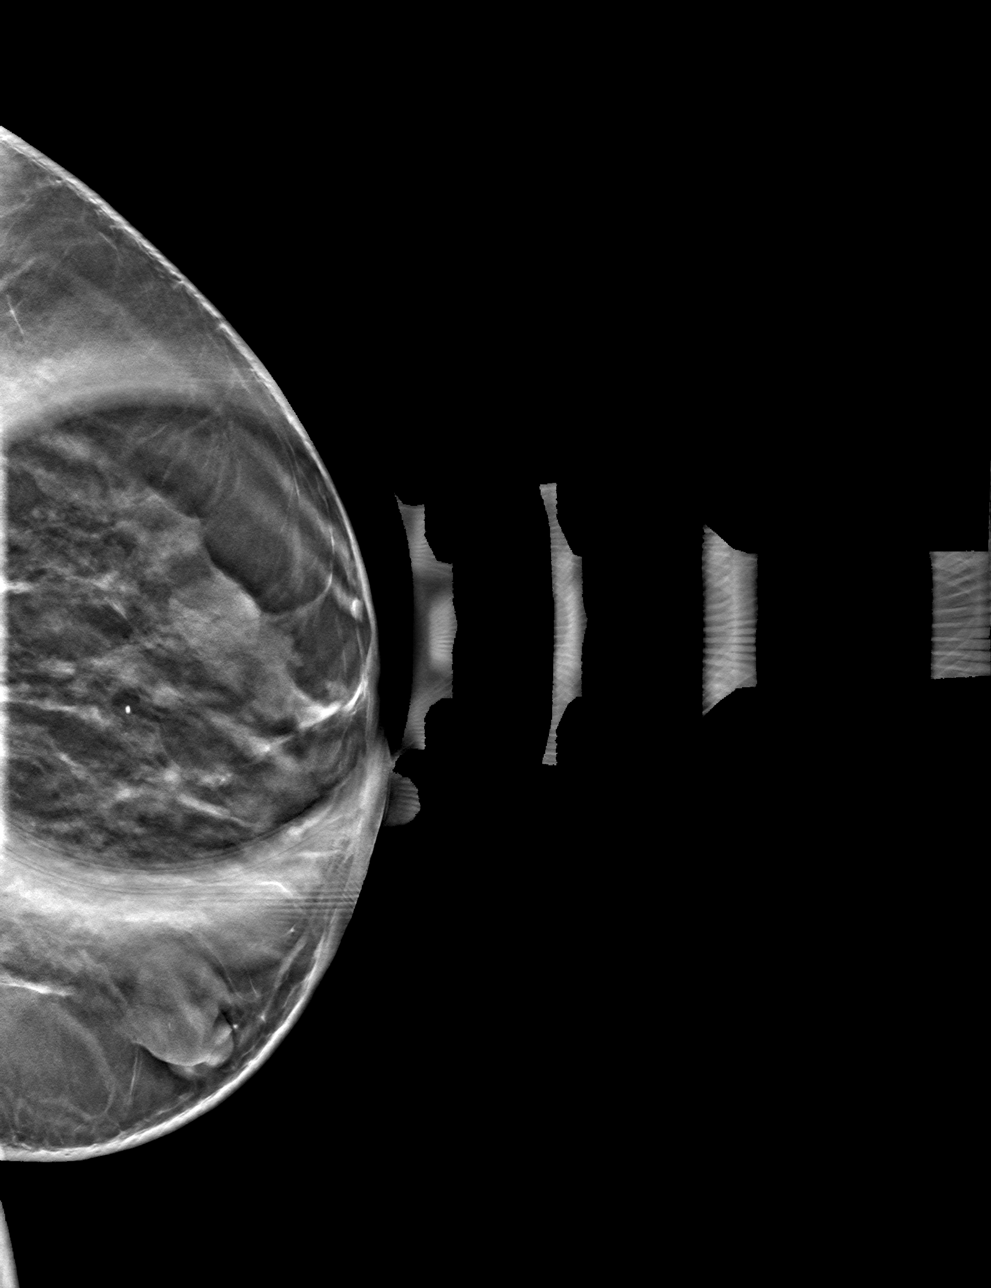

[L CC tomo · tomo slice 30/59.0]
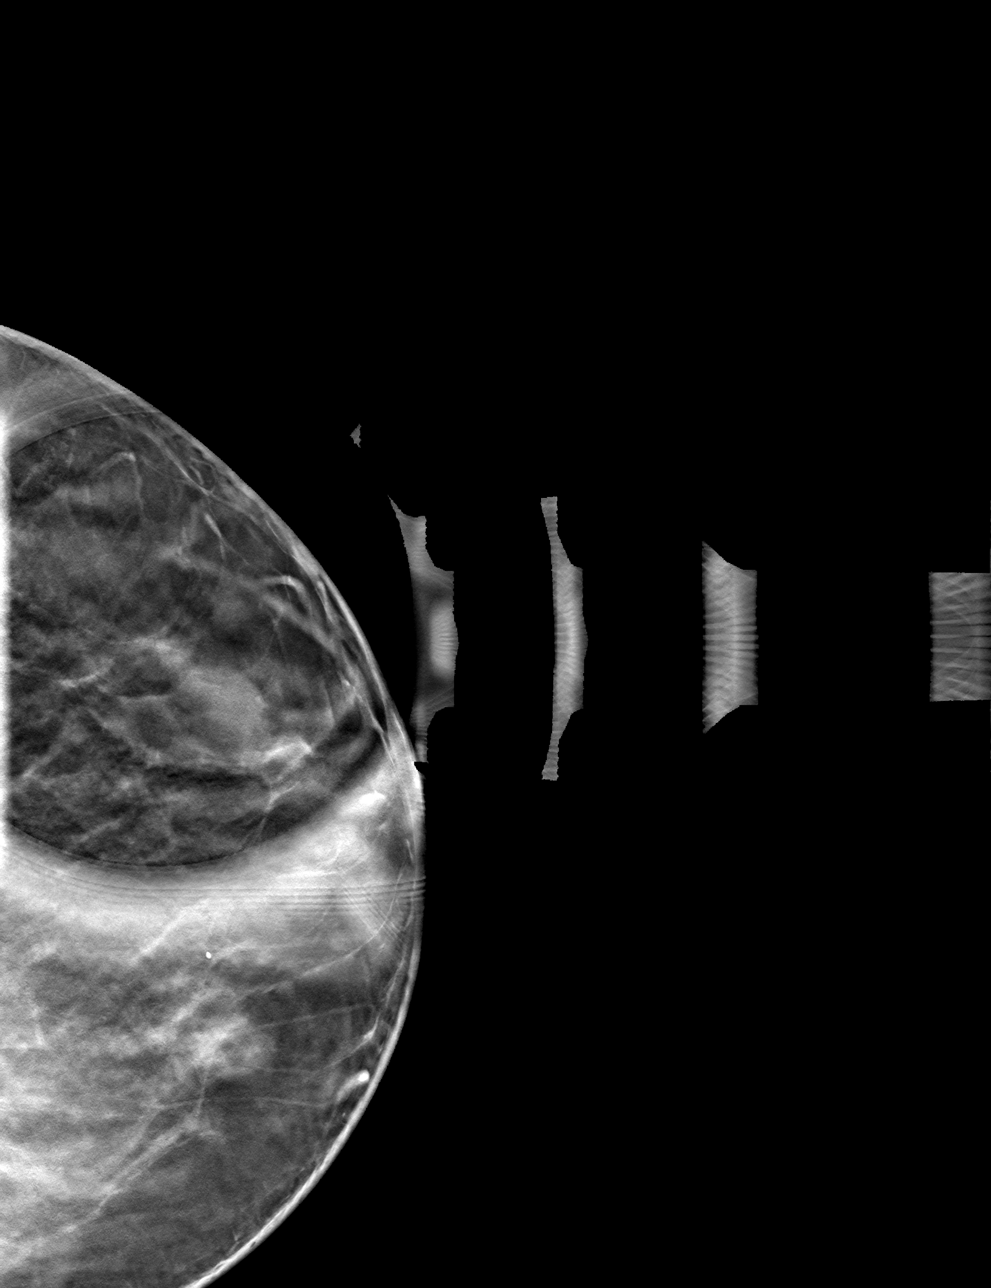

[4 of 12 positions shown; findings below may reference images not displayed]

ACR Breast Density Category c: The breast tissue is heterogeneously
dense, which may obscure small masses.
FINDINGS: There is a persistent round, circumscribed equal density mass in the
upper outer left breast at anterior depth. Further evaluation with
ultrasound was performed.

Mammographic images were processed with CAD.

Targeted ultrasound is performed, showing an oval, circumscribed
anechoic mass at the 1 o'clock position 2 cm from the nipple. It
measures 1.6 x 1.4 x 1.1 cm. There is no associated vascularity.
This correlates well with the mammographic finding and is consistent
with a simple cyst.
IMPRESSION: Benign left breast simple cyst corresponding with the screening
mammographic finding. No further imaging follow-up required.

RECOMMENDATION:
Screening mammogram in one year.(Code:CG-7-SDU)

I have discussed the findings and recommendations with the patient.
Results were also provided in writing at the conclusion of the
visit. If applicable, a reminder letter will be sent to the patient
regarding the next appointment.

BI-RADS CATEGORY  2: Benign.

## 2019-04-17 ENCOUNTER — Ambulatory Visit (INDEPENDENT_AMBULATORY_CARE_PROVIDER_SITE_OTHER): Payer: Managed Care, Other (non HMO) | Admitting: Obstetrics and Gynecology

## 2019-04-17 ENCOUNTER — Other Ambulatory Visit (HOSPITAL_COMMUNITY)
Admission: RE | Admit: 2019-04-17 | Discharge: 2019-04-17 | Disposition: A | Discharge status: Home / Self Care | Payer: Managed Care, Other (non HMO) | Source: Ambulatory Visit | Attending: Obstetrics and Gynecology | Admitting: Obstetrics and Gynecology

## 2019-04-17 ENCOUNTER — Other Ambulatory Visit: Payer: Self-pay

## 2019-04-17 ENCOUNTER — Encounter: Payer: Self-pay | Admitting: Obstetrics and Gynecology

## 2019-04-17 VITALS — BP 126/84 | HR 60 | Ht 62.0 in | Wt 180.0 lb

## 2019-04-17 DIAGNOSIS — Z1239 Encounter for other screening for malignant neoplasm of breast: Secondary | ICD-10-CM

## 2019-04-17 DIAGNOSIS — Z124 Encounter for screening for malignant neoplasm of cervix: Secondary | ICD-10-CM | POA: Diagnosis not present

## 2019-04-17 DIAGNOSIS — Z01419 Encounter for gynecological examination (general) (routine) without abnormal findings: Secondary | ICD-10-CM | POA: Diagnosis not present

## 2019-04-17 DIAGNOSIS — F5222 Female sexual arousal disorder: Secondary | ICD-10-CM

## 2019-04-17 MED ORDER — NORETHINDRONE ACETATE 5 MG PO TABS: ORAL_TABLET | ORAL | 3 refills | Status: DC

## 2019-04-17 NOTE — Patient Instructions (Signed)
[facility] [address] [place] [address]  [facility] [place]  [address] [place]  [address]  Phone: [phone]  

## 2019-04-17 NOTE — Progress Notes (Signed)
Gynecology Annual Exam  PCP: McLean-Scocuzza, Pasty Spillersracy N, MD  Chief Complaint:  Chief Complaint  Patient presents with   Gynecologic Exam    History of Present Illness: Patient is a 43 y.o. G2P2 presents for annual exam. The patient has no complaints today.   LMP: No LMP recorded. Absent on norethindrone continuous for endometriosis.  Continues to report good symptoms relief on this regimen.  The patient is sexually active. She currently uses tubal ligation for contraception. She denies dyspareunia.  The patient does perform self breast exams.  There is no notable family history of breast or ovarian cancer in her family.  The patient wears seatbelts: yes.   The patient has regular exercise: not asked.    The patient denies current symptoms of depression.    Review of Systems: Review of Systems  Constitutional: Negative for chills and fever.  HENT: Negative for congestion.   Respiratory: Negative for cough and shortness of breath.   Cardiovascular: Negative for chest pain and palpitations.  Gastrointestinal: Negative for abdominal pain, constipation, diarrhea, heartburn, nausea and vomiting.  Genitourinary: Negative for dysuria, frequency and urgency.  Skin: Negative for itching and rash.  Neurological: Negative for dizziness and headaches.  Endo/Heme/Allergies: Negative for polydipsia.  Psychiatric/Behavioral: Negative for depression.    Past Medical History:  Past Medical History:  Diagnosis Date   Allergy    Asthma    Chicken pox    Endometriosis    Obesity    Vitamin D deficiency     Past Surgical History:  Past Surgical History:  Procedure Laterality Date   BREAST BIOPSY Left 2006   benign, fibradenoma, clip placed   BREAST SURGERY     breast biopsy left breast   Pelvic adhesion surgery  2007   TUBAL LIGATION Bilateral 2007    Gynecologic History:  No LMP recorded. Contraception: tubal ligation Last Pap: Results were: 12/04/2012 NIL and HR HPV  negative  Last mammogram: 06/21/2018 Results were: BI-RAD II  Obstetric History: G2P2  Family History:  Family History  Problem Relation Age of Onset   Hypertension Mother    Cancer Mother [id number]       one breast/tn; recurrence with a different type of breast cancer   Stroke Mother    Breast cancer Mother    Diabetes Father    Hypertension Father    Cancer Father [id number]       liver cancer   Lung cancer Father    Hyperlipidemia Father    Stroke Father    Asthma Daughter    Cancer Maternal Grandmother        lung cancer   Cancer Maternal Grandfather        lung cancer   Diabetes Paternal Grandfather    Hypertension Paternal Grandfather     Social History:  Social History   Socioeconomic History   Marital status: Married    Spouse name: Not on file   Number of children: Not on file   Years of education: Not on file   Highest education level: Not on file  Occupational History   Not on file  Social Needs   Financial resource strain: Not on file   Food insecurity    Worry: Not on file    Inability: Not on file   Transportation needs    Medical: Not on file    Non-medical: Not on file  Tobacco Use   Smoking status: Light Tobacco Smoker    Packs/day: 0.25    Years:  10.00    Pack years: 2.50    Types: Cigarettes   Smokeless tobacco: Never Used  Substance and Sexual Activity   Alcohol use: Yes    Comment: rare   Drug use: No   Sexual activity: Yes    Partners: Male    Birth control/protection: Surgical  Lifestyle   Physical activity    Days per week: 2 days    Minutes per session: 20 min   Stress: Not at all  Relationships   Social connections    Talks on phone: More than three times a week    Gets together: Once a week    Attends religious service: Never    Active member of club or organization: No    Attends meetings of clubs or organizations: Never    Relationship status: Married   Intimate partner violence    Fear of  current or ex partner: No    Emotionally abused: No    Physically abused: No    Forced sexual activity: No  Other Topics Concern   Not on file  Social History Narrative   Married    Works [employer]    2 kids 45 y.o and almost 26 y.o daughters as of 01/13/18     Allergies:  No Known Allergies  Medications: Prior to Admission medications   Medication Sig Start Date End Date Taking? Authorizing Provider  carbamide peroxide (DEBROX) 6.5 % OTIC solution Place 5 drops into the left ear 2 (two) times daily. To left ear in 1 week 05/23/18   McLean-Scocuzza, Pasty Spillers, MD  ibuprofen (ADVIL,MOTRIN) 800 MG tablet Take 800 mg by mouth every 8 (eight) hours as needed.    [provider]  neomycin-polymyxin-hydrocortisone (CORTISPORIN) OTIC solution Place 4 drops into both ears 4 (four) times daily. X 7-10 days 05/23/18   McLean-Scocuzza, Pasty Spillers, MD  norethindrone (AYGESTIN) 5 MG tablet TAKE 1 TABLET(5 MG) BY MOUTH DAILY 03/27/19   Vena Austria, MD  phentermine (ADIPEX-P) 37.5 MG tablet Take 1 tablet (37.5 mg total) by mouth daily before breakfast. 05/23/18   McLean-Scocuzza, Pasty Spillers, MD  Vitamin D, Ergocalciferol, (DRISDOL) 50000 units CAPS capsule Take 1 capsule (50,000 Units total) by mouth every 7 (seven) days. 05/23/18   McLean-Scocuzza, Pasty Spillers, MD    Physical Exam Vitals: Blood pressure 126/84, pulse 60, height 5\' 2"  (1.575 m), weight 180 lb (81.6 kg).  General: NAD HEENT: normocephalic, anicteric Thyroid: no enlargement, no palpable nodules Pulmonary: No increased work of breathing, CTAB Cardiovascular: RRR, distal pulses 2+ Breast: Breast symmetrical, no tenderness, no palpable nodules or masses, no skin or nipple retraction present, no nipple discharge.  No axillary or supraclavicular lymphadenopathy. Abdomen: NABS, soft, non-tender, non-distended.  Umbilicus without lesions.  No hepatomegaly, splenomegaly or masses palpable. No evidence of hernia  Genitourinary:  External:  Normal external female genitalia.  Normal urethral meatus, normal Bartholin's and Skene's glands.    Vagina: Normal vaginal mucosa, no evidence of prolapse.    Cervix: Grossly normal in appearance, no bleeding  Uterus: Non-enlarged, mobile, normal contour.  No CMT  Adnexa: ovaries non-enlarged, no adnexal masses  Rectal: deferred  Lymphatic: no evidence of inguinal lymphadenopathy Extremities: no edema, erythema, or tenderness Neurologic: Grossly intact Psychiatric: mood appropriate, affect full  Female chaperone present for pelvic and breast  portions of the physical exam    Assessment: 43 y.o. 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  Mammogram Was ordered today   2) STI screening  was notoffered and therefore not obtained  3) ASCCP guidelines and rational discussed.  Patient opts for every 3 years screening interval  4) Contraception - the patient is currently using  tubal ligation.  She is not currently in need of contraception secondary to being sterile  5) Colonoscopy -- age 53  6) Low libido - discussed addy vs vylessi, given handouts on both   6) Routine healthcare maintenance including cholesterol, diabetes screening discussed managed by PCP  7) Return in about 1 year (around 04/16/2020) for annual.   Malachy Mood, MD, [place], [facility] 04/17/2019, 2:47 PM

## 2019-04-22 LAB — CYTOLOGY - PAP
Diagnosis: NEGATIVE
High risk HPV: NEGATIVE
Molecular Disclaimer: 56
Molecular Disclaimer: DETECTED
Molecular Disclaimer: NORMAL

## 2019-04-24 ENCOUNTER — Encounter: Payer: Self-pay | Admitting: Obstetrics and Gynecology

## 2019-07-03 ENCOUNTER — Ambulatory Visit
Admission: RE | Admit: 2019-07-03 | Discharge: 2019-07-03 | Disposition: A | Discharge status: Home / Self Care | Payer: Managed Care, Other (non HMO) | Source: Ambulatory Visit | Attending: Obstetrics and Gynecology | Admitting: Obstetrics and Gynecology

## 2019-07-03 DIAGNOSIS — Z1231 Encounter for screening mammogram for malignant neoplasm of breast: Secondary | ICD-10-CM | POA: Insufficient documentation

## 2019-07-03 DIAGNOSIS — Z1239 Encounter for other screening for malignant neoplasm of breast: Secondary | ICD-10-CM

## 2019-12-01 ENCOUNTER — Encounter: Payer: Self-pay | Admitting: Internal Medicine

## 2019-12-03 ENCOUNTER — Other Ambulatory Visit: Payer: Self-pay | Admitting: Internal Medicine

## 2019-12-03 DIAGNOSIS — F419 Anxiety disorder, unspecified: Secondary | ICD-10-CM

## 2019-12-03 MED ORDER — DIAZEPAM 5 MG PO TABS: 5.0000 mg | ORAL_TABLET | Freq: Two times a day (BID) | ORAL | 0 refills | Status: DC | PRN

## 2020-04-09 ENCOUNTER — Other Ambulatory Visit: Payer: Self-pay | Admitting: Obstetrics and Gynecology

## 2020-04-11 MED ORDER — NORETHINDRONE ACETATE 5 MG PO TABS: ORAL_TABLET | ORAL | 0 refills | Status: DC

## 2020-04-11 NOTE — Telephone Encounter (Signed)
Pt calling; has scheduled appt for 10/6th; needs refill of norethindrone to get to appt.  [phone]  Pt aware refill eRx'd.

## 2020-04-11 NOTE — Addendum Note (Signed)
Addended by: Loran Senters D on: 04/11/2020 03:06 PM   Modules accepted: Orders

## 2020-05-07 ENCOUNTER — Encounter: Payer: Self-pay | Admitting: Obstetrics and Gynecology

## 2020-05-07 ENCOUNTER — Other Ambulatory Visit: Payer: Self-pay

## 2020-05-07 ENCOUNTER — Ambulatory Visit (INDEPENDENT_AMBULATORY_CARE_PROVIDER_SITE_OTHER): Payer: Managed Care, Other (non HMO) | Admitting: Obstetrics and Gynecology

## 2020-05-07 VITALS — BP 128/90 | Ht 62.0 in | Wt 176.0 lb

## 2020-05-07 DIAGNOSIS — Z1239 Encounter for other screening for malignant neoplasm of breast: Secondary | ICD-10-CM

## 2020-05-07 DIAGNOSIS — N809 Endometriosis, unspecified: Secondary | ICD-10-CM

## 2020-05-07 DIAGNOSIS — Z01419 Encounter for gynecological examination (general) (routine) without abnormal findings: Secondary | ICD-10-CM | POA: Diagnosis not present

## 2020-05-07 DIAGNOSIS — B3731 Acute candidiasis of vulva and vagina: Secondary | ICD-10-CM

## 2020-05-07 DIAGNOSIS — Z124 Encounter for screening for malignant neoplasm of cervix: Secondary | ICD-10-CM | POA: Diagnosis not present

## 2020-05-07 DIAGNOSIS — B373 Candidiasis of vulva and vagina: Secondary | ICD-10-CM

## 2020-05-07 MED ORDER — FLUCONAZOLE 150 MG PO TABS: 150.0000 mg | ORAL_TABLET | Freq: Once | ORAL | 0 refills | Status: AC

## 2020-05-07 MED ORDER — NORETHINDRONE ACETATE 5 MG PO TABS: ORAL_TABLET | ORAL | 3 refills | Status: DC

## 2020-05-07 NOTE — Patient Instructions (Signed)
[facility] [address] [place] [address]  [facility] [place]  [address] [place] [address]  Phone: [phone]  

## 2020-05-07 NOTE — Progress Notes (Signed)
Gynecology Annual Exam  PCP: McLean-Scocuzza, Pasty Spillers, MD  Chief Complaint:  Chief Complaint  Patient presents with  . anuual    vaginal itching, dryness, discomfort     History of Present Illness: Patient is a 44 y.o. G2P2 presents for annual exam. The patient has no complaints today.   LMP: No LMP recorded. (Menstrual status: Oral contraceptives). Absent on norethindrone  The patient is sexually active. She currently uses tubal ligation for contraception. She denies dyspareunia.  The patient does perform self breast exams.  There is no notable family history of breast or ovarian cancer in her family.  The patient wears seatbelts: yes.   The patient has regular exercise: not asked.    The patient denies current symptoms of depression.    Review of Systems: Review of Systems  Constitutional: Negative for chills and fever.  HENT: Negative for congestion.   Respiratory: Negative for cough and shortness of breath.   Cardiovascular: Negative for chest pain and palpitations.  Gastrointestinal: Negative for abdominal pain, constipation, diarrhea, heartburn, nausea and vomiting.  Genitourinary: Negative for dysuria, frequency and urgency.  Skin: Negative for itching and rash.  Neurological: Negative for dizziness and headaches.  Endo/Heme/Allergies: Negative for polydipsia.  Psychiatric/Behavioral: Negative for depression.    Past Medical History:  Patient Active Problem List   Diagnosis Date Noted  . Obesity (BMI 30-39.9) 01/13/2018  . Vitamin D deficiency 01/13/2018  . 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 Endometriosis 10/07/2017  . Nonintractable headache 10/07/2017  . Family history of breast cancer 10/07/2017  . Ovarian cyst 09/05/2017  . Constipation 09/05/2017  . Chronic right-sided low back pain without sciatica 12/01/2016  . Right hip pain 04/16/2016  . Cutaneous skin tags 04/16/2016    Past Surgical History:  Past Surgical History:  Procedure Laterality Date    . 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 BREAST SURGERY     breast biopsy left breast  . Pelvic adhesion surgery  2007  . TUBAL LIGATION Bilateral 2007    Gynecologic History:  No LMP recorded. (Menstrual status: Oral contraceptives). Contraception: tubal ligation Last Pap: Results were: 04/17/2019 NIL and HR HPV negative  Last mammogram: 07/03/2019 Results were: BI-RAD I  Obstetric History: G2P2  Family History:  Family History  Problem Relation Age of Onset  . Hypertension Mother   . Cancer Mother 34       one breast/tn; recurrence with a different type of breast cancer  . Stroke Mother   . Breast cancer Mother 57  . Diabetes Father   . Hypertension Father   . Cancer Father 70       liver cancer  . Lung cancer Father   . Hyperlipidemia Father   . Stroke Father   . Asthma Daughter   . Cancer Maternal Grandmother        lung cancer  . Cancer Maternal Grandfather        lung cancer  . Diabetes Paternal Grandfather   . Hypertension Paternal Grandfather     Social History:  Social History   Socioeconomic History  . Marital status: Married    Spouse name: Not on file  . Number of children: Not on file  . Years of education: Not on file  . Highest education level: Not on file  Occupational History  . Not on file  Tobacco Use  . Smoking status: Light Tobacco Smoker    Packs/day: 0.25    Years: 10.00  Pack years: 2.50    Types: Cigarettes  . Smokeless tobacco: Never Used  Vaping Use  . Vaping Use: Never used  Substance and Sexual Activity  . Alcohol use: Yes    Comment: rare  . Drug use: No  . Sexual activity: Yes    Partners: Male    Birth control/protection: Surgical    Comment: tubual ligation   Other Topics Concern  . Not on file  Social History Narrative   Married    Works [employer]    2 kids 65 y.o and almost 73 y.o daughters as of 01/13/18    Social Determinants of Health   Financial Resource Strain:   . Difficulty  of Paying Living Expenses: Not on file  Food Insecurity:   . Worried About Programme researcher, broadcasting/film/video in the Last Year: Not on file  . Ran Out of Food in the Last Year: Not on file  Transportation Needs:   . Lack of Transportation (Medical): Not on file  . Lack of Transportation (Non-Medical): Not on file  Physical Activity:   . Days of Exercise per Week: Not on file  . Minutes of Exercise per Session: Not on file  Stress:   . Feeling of Stress : Not on file  Social Connections:   . Frequency of Communication with Friends and Family: Not on file  . Frequency of Social Gatherings with Friends and Family: Not on file  . Attends Religious Services: Not on file  . Active Member of Clubs or Organizations: Not on file  . Attends Banker Meetings: Not on file  . Marital Status: Not on file  Intimate Partner Violence:   . Fear of Current or Ex-Partner: Not on file  . Emotionally Abused: Not on file  . Physically Abused: Not on file  . Sexually Abused: Not on file    Allergies:  No Known Allergies  Medications: Prior to Admission medications   Medication Sig Start Date End Date Taking? Authorizing Provider  diazepam (VALIUM) 5 MG tablet Take 1 tablet (5 mg total) by mouth 2 (two) times daily as needed for anxiety. 12/03/19   McLean-Scocuzza, Pasty Spillers, MD  ibuprofen (ADVIL,MOTRIN) 800 MG tablet Take 800 mg by mouth every 8 (eight) hours as needed.    [provider]  norethindrone (AYGESTIN) 5 MG tablet TAKE 1 TABLET(5 MG) BY MOUTH DAILY 04/11/20   Vena Austria, MD  Vitamin D, Ergocalciferol, (DRISDOL) 50000 units CAPS capsule Take 1 capsule (50,000 Units total) by mouth every 7 (seven) days. 05/23/18   McLean-Scocuzza, Pasty Spillers, MD    Physical Exam Vitals: Blood pressure 128/90, height 5\' 2"  (1.575 m), weight 176 lb (79.8 kg). Body mass index is 32.19 kg/m.  General: NAD HEENT: normocephalic, anicteric Thyroid: no enlargement, no palpable nodules Pulmonary: No  increased work of breathing, CTAB Cardiovascular: RRR, distal pulses 2+ Breast: Breast symmetrical, no tenderness, no palpable nodules or masses, no skin or nipple retraction present, no nipple discharge.  No axillary or supraclavicular lymphadenopathy. Abdomen: NABS, soft, non-tender, non-distended.  Umbilicus without lesions.  No hepatomegaly, splenomegaly or masses palpable. No evidence of hernia  Genitourinary:  External: Normal external female genitalia.  Normal urethral meatus, normal Bartholin's and Skene's glands.    Vagina: Normal vaginal mucosa, no evidence of prolapse.    Cervix: Grossly normal in appearance, no bleeding  Uterus: Non-enlarged, mobile, normal contour.  No CMT  Adnexa: ovaries non-enlarged, no adnexal masses  Rectal: deferred  Lymphatic: no evidence of inguinal lymphadenopathy  Extremities: no edema, erythema, or tenderness Neurologic: Grossly intact Psychiatric: mood appropriate, affect full  Female chaperone present for pelvic and breast  portions of the physical exam  Immunization History  Administered Date(s) Administered  . Hepatitis B, adult 01/13/2018, 02/15/2018, 07/21/2018  . Janssen (J&J) SARS-COV-2 Vaccination 11/10/2019  . Tdap 10/07/2017     Assessment: 44 y.o. G2P2 routine annual exam  Plan: Problem List Items Addressed This Visit      Other   Endometriosis    Other Visit Diagnoses    Encounter for gynecological examination without abnormal finding    -  Primary   Breast screening       Relevant Orders   MM 3D SCREEN BREAST BILATERAL   Cervical cancer screening       Relevant Orders   IGP, Aptima HPV, rfx 16/18,45   Vaginal candida       Relevant Medications   fluconazole (DIFLUCAN) 150 MG tablet      1) Mammogram - recommend yearly screening mammogram.  Mammogram Was ordered today  2) STI screening  was notoffered and therefore not obtained  3) ASCCP guidelines and rational discussed.  Patient opts for every 3 years screening  interval  4) Contraception - the patient is currently using  tubal ligation.  She is not currently in need of contraception secondary to being sterile  5) Colonoscopy -- Screening recommended starting at age 21 for average risk individuals  6) Routine healthcare maintenance including cholesterol, diabetes screening discussed managed by PCP  7) Endometriosis - continue norethindrone  8) Return in about 1 year (around 05/07/2021) for Annual exam.   Vena Austria, MD, [facility], [facility] 05/07/2020, 11:20 AM

## 2020-05-12 LAB — IGP, APTIMA HPV, RFX 16/18,45: HPV Aptima: NEGATIVE

## 2020-05-22 ENCOUNTER — Ambulatory Visit: Payer: Managed Care, Other (non HMO) | Admitting: Internal Medicine

## 2020-05-22 ENCOUNTER — Encounter: Payer: Self-pay | Admitting: Internal Medicine

## 2020-05-22 ENCOUNTER — Other Ambulatory Visit: Payer: Self-pay

## 2020-05-22 VITALS — BP 124/80 | HR 64 | Temp 98.7°F | Ht 62.0 in | Wt 174.6 lb

## 2020-05-22 DIAGNOSIS — Z1389 Encounter for screening for other disorder: Secondary | ICD-10-CM | POA: Diagnosis not present

## 2020-05-22 DIAGNOSIS — H6122 Impacted cerumen, left ear: Secondary | ICD-10-CM

## 2020-05-22 DIAGNOSIS — Z0184 Encounter for antibody response examination: Secondary | ICD-10-CM

## 2020-05-22 DIAGNOSIS — Z1329 Encounter for screening for other suspected endocrine disorder: Secondary | ICD-10-CM | POA: Diagnosis not present

## 2020-05-22 DIAGNOSIS — D751 Secondary polycythemia: Secondary | ICD-10-CM

## 2020-05-22 DIAGNOSIS — E559 Vitamin D deficiency, unspecified: Secondary | ICD-10-CM

## 2020-05-22 DIAGNOSIS — Z0000 Encounter for general adult medical examination without abnormal findings: Secondary | ICD-10-CM | POA: Diagnosis not present

## 2020-05-22 DIAGNOSIS — F419 Anxiety disorder, unspecified: Secondary | ICD-10-CM

## 2020-05-22 DIAGNOSIS — R7989 Other specified abnormal findings of blood chemistry: Secondary | ICD-10-CM

## 2020-05-22 DIAGNOSIS — Z720 Tobacco use: Secondary | ICD-10-CM

## 2020-05-22 MED ORDER — CHOLECALCIFEROL 1.25 MG (50000 UT) PO CAPS: 50000.0000 [IU] | ORAL_CAPSULE | ORAL | 1 refills | Status: DC

## 2020-05-22 NOTE — Progress Notes (Signed)
Chief Complaint  Patient presents with  . Follow-up   Annual  1. Left ear reduced hearing + ear wax  2. Had J&J x 1 dose disc boosters with pfizer to consider declines flu shot  3. Anxiety/agoraphobia though improving and declines meds or therapy for now  4. Tobacco abuse 1/2 ppd to up to <1 ppd allergic to tape so will hold patch and gum/lozenges can cause DDI with Aygestin  Review of Systems  Constitutional: Negative for weight loss.  HENT: Positive for hearing loss.   Eyes: Negative for blurred vision.  Respiratory: Negative for shortness of breath.   Cardiovascular: Negative for chest pain.  Gastrointestinal: Negative for abdominal pain.  Musculoskeletal: Negative for back pain.  Skin: Negative for rash.  Neurological: Negative for headaches.  Psychiatric/Behavioral: Negative for depression.   Past Medical History:  Diagnosis Date  . Allergy   . Asthma   . Chicken pox   . Endometriosis   . Family history of breast cancer    9/20 cancer genetic testing letter sent  . Obesity   . Vitamin D deficiency    Past Surgical History:  Procedure Laterality Date  . 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 BREAST SURGERY     breast biopsy left breast  . Pelvic adhesion surgery  2007  . TUBAL LIGATION Bilateral 2007   Family History  Problem Relation Age of Onset  . Hypertension Mother   . Cancer Mother 63       one breast/tn; recurrence with a different type of breast cancer  . Stroke Mother   . Breast cancer Mother 34  . Diabetes Father   . Hypertension Father   . Cancer Father 14       liver cancer  . Lung cancer Father   . Hyperlipidemia Father   . Stroke Father   . Asthma Daughter   . Cancer Maternal Grandmother        lung cancer  . Cancer Maternal Grandfather        lung cancer  . Diabetes Paternal Grandfather   . Hypertension Paternal Grandfather   . Anxiety disorder Daughter    Social History   Socioeconomic History  . Marital  status: Married    Spouse name: Not on file  . Number of children: Not on file  . Years of education: Not on file  . Highest education level: Not on file  Occupational History  . Not on file  Tobacco Use  . Smoking status: Light Tobacco Smoker    Packs/day: 0.25    Years: 10.00    Pack years: 2.50    Types: Cigarettes  . Smokeless tobacco: Never Used  Vaping Use  . Vaping Use: Never used  Substance and Sexual Activity  . Alcohol use: Yes    Comment: rare  . Drug use: No  . Sexual activity: Yes    Partners: Male    Birth control/protection: Surgical    Comment: tubual ligation   Other Topics Concern  . Not on file  Social History Narrative   Married    Works [employer]    2 kids 88 y.o and almost 41 y.o daughters as of 01/13/18    Social Determinants of Health   Financial Resource Strain:   . Difficulty of Paying Living Expenses: Not on file  Food Insecurity:   . Worried About Programme researcher, broadcasting/film/video in the Last Year: Not on file  . Ran Out of Food in  the Last Year: Not on file  Transportation Needs:   . Lack of Transportation (Medical): Not on file  . Lack of Transportation (Non-Medical): Not on file  Physical Activity:   . Days of Exercise per Week: Not on file  . Minutes of Exercise per Session: Not on file  Stress:   . Feeling of Stress : Not on file  Social Connections:   . Frequency of Communication with Friends and Family: Not on file  . Frequency of Social Gatherings with Friends and Family: Not on file  . Attends Religious Services: Not on file  . Active Member of Clubs or Organizations: Not on file  . Attends Banker Meetings: Not on file  . Marital Status: Not on file  Intimate Partner Violence:   . Fear of Current or Ex-Partner: Not on file  . Emotionally Abused: Not on file  . Physically Abused: Not on file  . Sexually Abused: Not on file   Current Meds  Medication Sig  . diazepam (VALIUM) 5 MG tablet Take 1 tablet (5 mg total) by mouth 2  (two) times daily as needed for anxiety.  . norethindrone (AYGESTIN) 5 MG tablet TAKE 1 TABLET(5 MG) BY MOUTH DAILY   Allergies  Allergen Reactions  . Adhesive [Tape]    Recent Results (from the past 2160 hour(s))  IGP, Aptima HPV, rfx 16/18,45     Status: None   Collection Time: 05/07/20 11:29 AM  Result Value Ref Range   Interpretation NILM     Comment: NEGATIVE FOR INTRAEPITHELIAL LESION OR MALIGNANCY.   Category NIL     Comment: Negative for Intraepithelial Lesion   Adequacy ENDO     Comment: Satisfactory for evaluation. Endocervical and/or squamous metaplastic cells (endocervical component) are present.    Clinician Provided ICD10 Comment     Comment: Z12.4   Performed by: Comment     Comment: Modesta Messing, Cytotechnologist (ASCP)   Note: Comment     Comment: The Pap smear is a screening test designed to aid in the detection of premalignant and malignant conditions of the uterine cervix.  It is not a diagnostic procedure and should not be used as the sole means of detecting cervical cancer.  Both false-positive and false-negative reports do occur.    Test Methodology Comment     Comment: This liquid based ThinPrep(R) pap test was screened with the use of an image guided system.    HPV Aptima Negative Negative    Comment: This nucleic acid amplification test detects fourteen high-risk HPV types (16,18,31,33,35,39,45,51,52,56,58,59,66,68) without differentiation.    Objective  Body mass index is 31.93 kg/m. Wt Readings from Last 3 Encounters:  05/22/20 174 lb 9.6 oz (79.2 kg)  05/07/20 176 lb (79.8 kg)  04/17/19 180 lb (81.6 kg)   Temp Readings from Last 3 Encounters:  05/22/20 98.7 F (37.1 C) (Oral)  07/21/18 98.5 F (36.9 C) (Oral)  05/23/18 98.6 F (37 C) (Oral)   BP Readings from Last 3 Encounters:  05/22/20 124/80  05/07/20 128/90  04/17/19 126/84   Pulse Readings from Last 3 Encounters:  05/22/20 64  04/17/19 60  07/21/18 61    Physical  Exam Vitals and nursing note reviewed.  Constitutional:      Appearance: Normal appearance. She is well-developed and well-groomed. She is obese.  HENT:     Head: Normocephalic and atraumatic.     Left Ear: There is impacted cerumen.  Eyes:     Conjunctiva/sclera: Conjunctivae normal.  Pupils: Pupils are equal, round, and reactive to light.  Cardiovascular:     Rate and Rhythm: Normal rate and regular rhythm.     Heart sounds: Normal heart sounds. No murmur heard.   Pulmonary:     Effort: Pulmonary effort is normal.     Breath sounds: Normal breath sounds.  Abdominal:     Tenderness: There is no abdominal tenderness.  Skin:    General: Skin is warm and dry.  Neurological:     General: No focal deficit present.     Mental Status: She is alert and oriented to person, place, and time. Mental status is at baseline.     Gait: Gait normal.  Psychiatric:        Attention and Perception: Attention and perception normal.        Mood and Affect: Mood and affect normal.        Speech: Speech normal.        Behavior: Behavior normal. Behavior is cooperative.        Thought Content: Thought content normal.        Cognition and Memory: Cognition and memory normal.        Judgment: Judgment normal.     Assessment  Plan  Annual physical exam -  Declines flu  Tdap utd 09/2017 J&J consider pfizer Given hep b vaccine 3/3 check titer    mammogram 07/03/19 ordered ob/gyn 2021 Dr. Bonney Aid -if she wants genetic testing due to FH breast cancer in future needs counseling 1st (genetics) to get [employer] to pay for it  Pap 05/07/20 neg neg HPV Smoking increased 1/2 to <1 ppd rec cessation filled out paperwork for work Colonoscopy age 89  rec healthy diet and exercise      Impacted cerumen of left ear Consented and tolerated left ear lavage with currette and removed   Anxiety Declines meds and therapy   Tobacco abuse  rec smoking cessation and work with program at work   Provider:  Dr. French Ana McLean-Scocuzza-Internal Medicine

## 2020-05-22 NOTE — Patient Instructions (Addendum)
Vitamin D3 4000 IU to max 5000 IU daily  [employer] nutrition plan  The Next 56 Days Nutrition Program online    Exercising to Lose Weight Exercise is structured, repetitive physical activity to improve fitness and health. Getting regular exercise is important for everyone. It is especially important if you are overweight. Being overweight increases your risk of heart disease, stroke, diabetes, high blood pressure, and several types of cancer. Reducing your calorie intake and exercising can help you lose weight. Exercise is usually categorized as moderate or vigorous intensity. To lose weight, most people need to do a certain amount of moderate-intensity or vigorous-intensity exercise each week. Moderate-intensity exercise  Moderate-intensity exercise is any activity that gets you moving enough to burn at least three times more energy (calories) than if you were sitting. Examples of moderate exercise include:  Walking a mile in 15 minutes.  Doing light yard work.  Biking at an easy pace. Most people should get at least 150 minutes (2 hours and 30 minutes) a week of moderate-intensity exercise to maintain their body weight. Vigorous-intensity exercise Vigorous-intensity exercise is any activity that gets you moving enough to burn at least six times more calories than if you were sitting. When you exercise at this intensity, you should be working hard enough that you are not able to carry on a conversation. Examples of vigorous exercise include:  Running.  Playing a team sport, such as football, basketball, and soccer.  Jumping rope. Most people should get at least 75 minutes (1 hour and 15 minutes) a week of vigorous-intensity exercise to maintain their body weight. How can exercise affect me? When you exercise enough to burn more calories than you eat, you lose weight. Exercise also reduces body fat and builds muscle. The more muscle you have, the more calories you burn. Exercise  also:  Improves mood.  Reduces stress and tension.  Improves your overall fitness, flexibility, and endurance.  Increases bone strength. The amount of exercise you need to lose weight depends on:  Your age.  The type of exercise.  Any health conditions you have.  Your overall physical ability. Talk to your health care provider about how much exercise you need and what types of activities are safe for you. What actions can I take to lose weight? Nutrition   Make changes to your diet as told by your health care provider or diet and nutrition specialist (dietitian). This may include: ? Eating fewer calories. ? Eating more protein. ? Eating less unhealthy fats. ? Eating a diet that includes fresh fruits and vegetables, whole grains, low-fat dairy products, and lean protein. ? Avoiding foods with added fat, salt, and sugar.  Drink plenty of water while you exercise to prevent dehydration or heat stroke. Activity  Choose an activity that you enjoy and set realistic goals. Your health care provider can help you make an exercise plan that works for you.  Exercise at a moderate or vigorous intensity most days of the week. ? The intensity of exercise may vary from person to person. You can tell how intense a workout is for you by paying attention to your breathing and heartbeat. Most people will notice their breathing and heartbeat get faster with more intense exercise.  Do resistance training twice each week, such as: ? Push-ups. ? Sit-ups. ? Lifting weights. ? Using resistance bands.  Getting short amounts of exercise can be just as helpful as long structured periods of exercise. If you have trouble finding time to exercise, try  to include exercise in your daily routine. ? Get up, stretch, and walk around every 30 minutes throughout the day. ? Go for a walk during your lunch break. ? Park your car farther away from your destination. ? If you take public transportation, get off  one stop early and walk the rest of the way. ? Make phone calls while standing up and walking around. ? Take the stairs instead of elevators or escalators.  Wear comfortable clothes and shoes with good support.  Do not exercise so much that you hurt yourself, feel dizzy, or get very short of breath. Where to find more information  [place] Department of Health and Human Services: ThisPath.fiwww.hhs.gov  Centers for Disease Control and Prevention (CDC): FootballExhibition.com.brwww.cdc.gov Contact a health care provider:  Before starting a new exercise program.  If you have questions or concerns about your weight.  If you have a medical problem that keeps you from exercising. Get help right away if you have any of the following while exercising:  Injury.  Dizziness.  Difficulty breathing or shortness of breath that does not go away when you stop exercising.  Chest pain.  Rapid heartbeat. Summary  Being overweight increases your risk of heart disease, stroke, diabetes, high blood pressure, and several types of cancer.  Losing weight happens when you burn more calories than you eat.  Reducing the amount of calories you eat in addition to getting regular moderate or vigorous exercise each week helps you lose weight. This information is not intended to replace advice given to you by your health care provider. Make sure you discuss any questions you have with your health care provider. Document Revised: 08/01/2017 Document Reviewed: 08/01/2017 Elsevier Patient Education  2020 [employer] Inc.  Vitamin D Deficiency Vitamin D deficiency is when your body does not have enough vitamin D. Vitamin D is important to your body for many reasons:  It helps the body absorb two important minerals--calcium and phosphorus.  It plays a role in bone health.  It may help to prevent some diseases, such as diabetes and multiple sclerosis.  It plays a role in muscle function, including heart function. If vitamin D deficiency is  severe, it can cause a condition in which your bones become soft. In adults, this condition is called osteomalacia. In children, this condition is called rickets. What are the causes? This condition may be caused by:  Not eating enough foods that contain vitamin D.  Not getting enough natural sun exposure.  Having certain digestive system diseases that make it difficult for your body to absorb vitamin D. These diseases include Crohn's disease, chronic pancreatitis, and cystic fibrosis.  Having a surgery in which a part of the stomach or a part of the small intestine is removed.  Having chronic kidney disease or liver disease. What increases the risk? You are more likely to develop this condition if you:  Are older.  Do not spend much time outdoors.  Live in a long-term care facility.  Have had broken bones.  Have weak or thin bones (osteoporosis).  Have a disease or condition that changes how the body absorbs vitamin D.  Have dark skin.  Take certain medicines, such as steroid medicines or certain seizure medicines.  Are overweight or obese. What are the signs or symptoms? In mild cases of vitamin D deficiency, there may not be any symptoms. If the condition is severe, symptoms may include:  Bone pain.  Muscle pain.  Falling often.  Broken bones caused by a  minor injury. How is this diagnosed? This condition may be diagnosed with blood tests. Imaging tests such as X-rays may also be done to look for changes in the bone. How is this treated? Treatment for this condition may depend on what caused the condition. Treatment options include:  Taking vitamin D supplements. Your health care provider will suggest what dose is best for you.  Taking a calcium supplement. Your health care provider will suggest what dose is best for you. Follow these instructions at home: Eating and drinking   Eat foods that contain vitamin D. Choices include: ? Fortified dairy products,  cereals, or juices. Fortified means that vitamin D has been added to the food. Check the label on the package to see if the food is fortified. ? Fatty fish, such as salmon or trout. ? Eggs. ? Oysters. ? Mushrooms. The items listed above may not be a complete list of recommended foods and beverages. Contact a dietitian for more information. General instructions  Take medicines and supplements only as told by your health care provider.  Get regular, safe exposure to natural sunlight.  Do not use a tanning bed.  Maintain a healthy weight. Lose weight if needed.  Keep all follow-up visits as told by your health care provider. This is important. How is this prevented? You can get vitamin D by:  Eating foods that naturally contain vitamin D.  Eating or drinking products that have been fortified with vitamin D, such as cereals, juices, and dairy products (including milk).  Taking a vitamin D supplement or a multivitamin supplement that contains vitamin D.  Being in the sun. Your body naturally makes vitamin D when your skin is exposed to sunlight. Your body changes the sunlight into a form of the vitamin that it can use. Contact a health care provider if:  Your symptoms do not go away.  You feel nauseous or you vomit.  You have fewer bowel movements than usual or are constipated. Summary  Vitamin D deficiency is when your body does not have enough vitamin D.  Vitamin D is important to your body for good bone health and muscle function, and it may help prevent some diseases.  Vitamin D deficiency is primarily treated through supplementation. Your health care provider will suggest what dose is best for you.  You can get vitamin D by eating foods that contain vitamin D, by being in the sun, and by taking a vitamin D supplement or a multivitamin supplement that contains vitamin D. This information is not intended to replace advice given to you by your health care provider. Make sure  you discuss any questions you have with your health care provider. Document Revised: 03/27/2018 Document Reviewed: 03/27/2018 Elsevier Patient Education  2020 [employer].  Coping with Quitting Smoking  Quitting smoking is a physical and mental challenge. You will face cravings, withdrawal symptoms, and temptation. Before quitting, work with your health care provider to make a plan that can help you cope. Preparation can help you quit and keep you from giving in. How can I cope with cravings? Cravings usually last for 5-10 minutes. If you get through it, the craving will pass. Consider taking the following actions to help you cope with cravings:  Keep your mouth busy: ? Chew sugar-free gum. ? Suck on hard candies or a straw. ? Brush your teeth.  Keep your hands and body busy: ? Immediately change to a different activity when you feel a craving. ? Squeeze or play with a  ball. ? Do an activity or a hobby, like making bead jewelry, practicing needlepoint, or working with wood. ? Mix up your normal routine. ? Take a short exercise break. Go for a quick walk or run up and down stairs. ? Spend time in public places where smoking is not allowed.  Focus on doing something kind or helpful for someone else.  Call a friend or family member to talk during a craving.  Join a support group.  Call a quit line, such as 1-800-QUIT-NOW.  Talk with your health care provider about medicines that might help you cope with cravings and make quitting easier for you. How can I deal with withdrawal symptoms? Your body may experience negative effects as it tries to get used to not having nicotine in the system. These effects are called withdrawal symptoms. They may include:  Feeling hungrier than normal.  Trouble concentrating.  Irritability.  Trouble sleeping.  Feeling depressed.  Restlessness and agitation.  Craving a cigarette. To manage withdrawal symptoms:  Avoid places, people, and  activities that trigger your cravings.  Remember why you want to quit.  Get plenty of sleep.  Avoid coffee and other caffeinated drinks. These may worsen some of your symptoms. How can I handle social situations? Social situations can be difficult when you are quitting smoking, especially in the first few weeks. To manage this, you can:  Avoid parties, bars, and other social situations where people might be smoking.  Avoid alcohol.  Leave right away if you have the urge to smoke.  Explain to your family and friends that you are quitting smoking. Ask for understanding and support.  Plan activities with friends or family where smoking is not an option. What are some ways I can cope with stress? Wanting to smoke may cause stress, and stress can make you want to smoke. Find ways to manage your stress. Relaxation techniques can help. For example:  Breathe slowly and deeply, in through your nose and out through your mouth.  Listen to soothing, relaxing music.  Talk with a family member or friend about your stress.  Light a candle.  Soak in a bath or take a shower.  Think about a peaceful place. What are some ways I can prevent weight gain? Be aware that many people gain weight after they quit smoking. However, not everyone does. To keep from gaining weight, have a plan in place before you quit and stick to the plan after you quit. Your plan should include:  Having healthy snacks. When you have a craving, it may help to: ? Eat plain popcorn, crunchy carrots, celery, or other cut vegetables. ? Chew sugar-free gum.  Changing how you eat: ? Eat small portion sizes at meals. ? Eat 4-6 small meals throughout the day instead of 1-2 large meals a day. ? Be mindful when you eat. Do not watch television or do other things that might distract you as you eat.  Exercising regularly: ? Make time to exercise each day. If you do not have time for a long workout, do short bouts of exercise for  5-10 minutes several times a day. ? Do some form of strengthening exercise, like weight lifting, and some form of aerobic exercise, like running or swimming.  Drinking plenty of water or other low-calorie or no-calorie drinks. Drink 6-8 glasses of water daily, or as much as instructed by your health care provider. Summary  Quitting smoking is a physical and mental challenge. You will face cravings, withdrawal symptoms,  and temptation to smoke again. Preparation can help you as you go through these challenges.  You can cope with cravings by keeping your mouth busy (such as by chewing gum), keeping your body and hands busy, and making calls to family, friends, or a helpline for people who want to quit smoking.  You can cope with withdrawal symptoms by avoiding places where people smoke, avoiding drinks with caffeine, and getting plenty of rest.  Ask your health care provider about the different ways to prevent weight gain, avoid stress, and handle social situations. This information is not intended to replace advice given to you by your health care provider. Make sure you discuss any questions you have with your health care provider. Document Revised: 07/01/2017 Document Reviewed: 07/16/2016 Elsevier Patient Education  2020 [employer].

## 2020-05-22 NOTE — Telephone Encounter (Signed)
Patient currently in office. Form printed, filled out, and handed to the provider

## 2020-05-23 DIAGNOSIS — Z720 Tobacco use: Secondary | ICD-10-CM | POA: Insufficient documentation

## 2020-05-23 DIAGNOSIS — F419 Anxiety disorder, unspecified: Secondary | ICD-10-CM | POA: Insufficient documentation

## 2020-05-26 LAB — CBC WITH DIFFERENTIAL/PLATELET
Hemoglobin: 16.1 g/dL — ABNORMAL HIGH (ref 11.1–15.9)
MCV: 93 fL (ref 79–97)
Monocytes: 6 %
RBC: 5.13 x10E6/uL (ref 3.77–5.28)

## 2020-05-26 LAB — URINALYSIS, ROUTINE W REFLEX MICROSCOPIC: Ketones, UA: NEGATIVE

## 2020-05-26 LAB — TSH

## 2020-05-26 LAB — COMPREHENSIVE METABOLIC PANEL

## 2020-05-26 LAB — LIPID PANEL

## 2020-05-26 LAB — SARS-COV-2 SEMI-QUANTITATIVE TOTAL ANTIBODY, SPIKE

## 2020-05-26 LAB — HEPATITIS B SURFACE ANTIBODY, QUANTITATIVE

## 2020-05-27 LAB — URINALYSIS, ROUTINE W REFLEX MICROSCOPIC
Bilirubin, UA: NEGATIVE
Glucose, UA: NEGATIVE
Leukocytes,UA: NEGATIVE
Nitrite, UA: NEGATIVE
RBC, UA: NEGATIVE
Specific Gravity, UA: 1.021 (ref 1.005–1.030)
Urobilinogen, Ur: 0.2 mg/dL (ref 0.2–1.0)
pH, UA: 5.5 (ref 5.0–7.5)

## 2020-05-27 LAB — COMPREHENSIVE METABOLIC PANEL
ALT: 18 IU/L (ref 0–32)
AST: 16 IU/L (ref 0–40)
Albumin/Globulin Ratio: 1.6 (ref 1.2–2.2)
Albumin: 4.2 g/dL (ref 3.8–4.8)
Alkaline Phosphatase: 41 IU/L — ABNORMAL LOW (ref 44–121)
BUN/Creatinine Ratio: 6 — ABNORMAL LOW (ref 9–23)
BUN: 6 mg/dL (ref 6–24)
Bilirubin Total: 0.4 mg/dL (ref 0.0–1.2)
CO2: 19 mmol/L — ABNORMAL LOW (ref 20–29)
Calcium: 9.3 mg/dL (ref 8.7–10.2)
Chloride: 106 mmol/L (ref 96–106)
Creatinine, Ser: 1.04 mg/dL — ABNORMAL HIGH (ref 0.57–1.00)
GFR calc Af Amer: 77 mL/min/{1.73_m2} (ref >59)
Globulin, Total: 2.6 g/dL (ref 1.5–4.5)
Glucose: 81 mg/dL (ref 65–99)
Potassium: 4.2 mmol/L (ref 3.5–5.2)
Sodium: 138 mmol/L (ref 134–144)

## 2020-05-27 LAB — CBC WITH DIFFERENTIAL/PLATELET
Basophils Absolute: 0 10*3/uL (ref 0.0–0.2)
Basos: 1 %
EOS (ABSOLUTE): 0.3 10*3/uL (ref 0.0–0.4)
Eos: 5 %
Hematocrit: 47.6 % — ABNORMAL HIGH (ref 34.0–46.6)
Immature Grans (Abs): 0 10*3/uL (ref 0.0–0.1)
Immature Granulocytes: 0 %
Lymphocytes Absolute: 2.5 10*3/uL (ref 0.7–3.1)
Lymphs: 44 %
MCH: 31.4 pg (ref 26.6–33.0)
MCHC: 33.8 g/dL (ref 31.5–35.7)
Monocytes Absolute: 0.3 10*3/uL (ref 0.1–0.9)
Neutrophils Absolute: 2.5 10*3/uL (ref 1.4–7.0)
Neutrophils: 44 %
Platelets: 243 10*3/uL (ref 150–450)
RDW: 13.2 % (ref 11.7–15.4)
WBC: 5.5 10*3/uL (ref 3.4–10.8)

## 2020-05-27 LAB — HEMOGLOBIN A1C
Est. average glucose Bld gHb Est-mCnc: 100 mg/dL
Hgb A1c MFr Bld: 5.1 % (ref 4.8–5.6)

## 2020-05-27 LAB — LIPID PANEL
Chol/HDL Ratio: 4.3 ratio (ref 0.0–4.4)
HDL: 31 mg/dL — ABNORMAL LOW (ref >39)
LDL Chol Calc (NIH): 86 mg/dL (ref 0–99)
Triglycerides: 73 mg/dL (ref 0–149)

## 2020-05-27 LAB — VITAMIN D 25 HYDROXY (VIT D DEFICIENCY, FRACTURES): Vit D, 25-Hydroxy: 18.9 ng/mL — ABNORMAL LOW (ref 30.0–100.0)

## 2020-05-27 LAB — SARS-COV-2 SEMI-QUANTITATIVE TOTAL ANTIBODY, SPIKE: SARS-CoV-2 Semi-Quant Total Ab: 61.2 U/mL (ref <0.8)

## 2020-05-27 NOTE — Addendum Note (Signed)
Addended by: Quentin Ore on: 05/27/2020 08:24 AM   Modules accepted: Orders

## 2020-05-29 ENCOUNTER — Other Ambulatory Visit: Payer: Self-pay

## 2020-05-29 NOTE — Addendum Note (Signed)
Addended by: Quentin Ore on: 05/29/2020 09:34 PM   Modules accepted: Orders

## 2020-05-29 NOTE — Addendum Note (Signed)
Addended by: Quentin Ore on: 05/29/2020 09:37 PM   Modules accepted: Orders

## 2020-06-30 ENCOUNTER — Other Ambulatory Visit: Payer: Self-pay | Admitting: Advanced Practice Midwife

## 2020-06-30 DIAGNOSIS — N898 Other specified noninflammatory disorders of vagina: Secondary | ICD-10-CM

## 2020-06-30 MED ORDER — FLUCONAZOLE 150 MG PO TABS: 150.0000 mg | ORAL_TABLET | Freq: Once | ORAL | 0 refills | Status: AC

## 2020-06-30 NOTE — Progress Notes (Signed)
Rx diflucan sent for patient per her request.

## 2020-07-10 ENCOUNTER — Other Ambulatory Visit: Payer: Self-pay

## 2020-07-10 ENCOUNTER — Ambulatory Visit
Admission: RE | Admit: 2020-07-10 | Discharge: 2020-07-10 | Disposition: A | Discharge status: Home / Self Care | Payer: Managed Care, Other (non HMO) | Source: Ambulatory Visit | Attending: Obstetrics and Gynecology | Admitting: Obstetrics and Gynecology

## 2020-07-10 DIAGNOSIS — Z1239 Encounter for other screening for malignant neoplasm of breast: Secondary | ICD-10-CM

## 2020-07-10 DIAGNOSIS — Z1231 Encounter for screening mammogram for malignant neoplasm of breast: Secondary | ICD-10-CM | POA: Insufficient documentation

## 2021-05-12 ENCOUNTER — Other Ambulatory Visit: Payer: Self-pay | Admitting: Obstetrics and Gynecology

## 2021-05-12 MED ORDER — NORETHINDRONE ACETATE 5 MG PO TABS: ORAL_TABLET | ORAL | 0 refills | Status: DC

## 2021-05-12 NOTE — Telephone Encounter (Signed)
Needs an appointment.

## 2021-05-22 ENCOUNTER — Ambulatory Visit (INDEPENDENT_AMBULATORY_CARE_PROVIDER_SITE_OTHER): Payer: Managed Care, Other (non HMO) | Admitting: Internal Medicine

## 2021-05-22 ENCOUNTER — Encounter: Payer: Self-pay | Admitting: Internal Medicine

## 2021-05-22 ENCOUNTER — Other Ambulatory Visit: Payer: Self-pay

## 2021-05-22 VITALS — BP 110/80 | HR 67 | Temp 98.1°F | Ht 63.58 in | Wt 185.2 lb

## 2021-05-22 DIAGNOSIS — Z1231 Encounter for screening mammogram for malignant neoplasm of breast: Secondary | ICD-10-CM

## 2021-05-22 DIAGNOSIS — Z0000 Encounter for general adult medical examination without abnormal findings: Secondary | ICD-10-CM

## 2021-05-22 DIAGNOSIS — Z1389 Encounter for screening for other disorder: Secondary | ICD-10-CM

## 2021-05-22 DIAGNOSIS — Z1329 Encounter for screening for other suspected endocrine disorder: Secondary | ICD-10-CM

## 2021-05-22 DIAGNOSIS — F17200 Nicotine dependence, unspecified, uncomplicated: Secondary | ICD-10-CM

## 2021-05-22 DIAGNOSIS — E559 Vitamin D deficiency, unspecified: Secondary | ICD-10-CM

## 2021-05-22 DIAGNOSIS — E669 Obesity, unspecified: Secondary | ICD-10-CM

## 2021-05-22 DIAGNOSIS — E611 Iron deficiency: Secondary | ICD-10-CM

## 2021-05-22 NOTE — Progress Notes (Signed)
Chief Complaint  Patient presents with   Annual Exam   Annual  1. Endometriosis stable on aygestin 5 mg f/u Dr. Bonney Aid ob/gyn 2. Stressors at home due to health issues with the family and she has to coordinate care for everyone but doing better and working a lot of hours  3. Smoking rec cessation    Review of Systems  Constitutional:  Negative for weight loss.  HENT:  Negative for hearing loss.   Eyes:  Negative for blurred vision.  Respiratory:  Negative for shortness of breath.   Cardiovascular:  Negative for chest pain.  Gastrointestinal:  Negative for abdominal pain.  Genitourinary:  Negative for dysuria.  Musculoskeletal:  Negative for falls and joint pain.  Skin:  Negative for rash.  Neurological:  Negative for headaches.  Psychiatric/Behavioral:         +stress work and family health issues  Past Medical History:  Diagnosis Date   Allergy    Asthma    Chicken pox    Endometriosis    Family history of breast cancer    9/20 cancer genetic testing letter sent   Obesity    Vitamin D deficiency    Past Surgical History:  Procedure Laterality Date   BREAST BIOPSY Left 2006   benign, fibradenoma, clip placed   BREAST SURGERY     breast biopsy left breast   Pelvic adhesion surgery  2007   TUBAL LIGATION Bilateral 2007   Family History  Problem Relation Age of Onset   Hypertension Mother    Cancer Mother 33       one breast/tn; recurrence with a different type of breast cancer   Stroke Mother    Breast cancer Mother 7       then dx 29    Diabetes Father    Hypertension Father    Cancer Father 82       liver cancer   Lung cancer Father    Hyperlipidemia Father    Stroke Father    Asthma Daughter    Cancer Maternal Grandmother        lung cancer   Cancer Maternal Grandfather        lung cancer   Diabetes Paternal Grandfather    Hypertension Paternal Grandfather    Anxiety disorder Daughter    Social History   Socioeconomic History   Marital status:  Married    Spouse name: Not on file   Number of children: Not on file   Years of education: Not on file   Highest education level: Not on file  Occupational History   Not on file  Tobacco Use   Smoking status: Light Smoker    Packs/day: 0.25    Years: 10.00    Pack years: 2.50    Types: Cigarettes   Smokeless tobacco: Never  Vaping Use   Vaping Use: Never used  Substance and Sexual Activity   Alcohol use: Yes    Comment: rare   Drug use: No   Sexual activity: Yes    Partners: Male    Birth control/protection: Surgical    Comment: tubual ligation   Other Topics Concern   Not on file  Social History Narrative   Married    Works [employer]    2 kids 43 y.o and almost 68 y.o daughters as of 01/13/18    Social Determinants of Health   Financial Resource Strain: Not on file  Food Insecurity: Not on file  Transportation Needs: Not on file  Physical Activity: Not on file  Stress: Not on file  Social Connections: Not on file  Intimate Partner Violence: Not on file   Current Meds  Medication Sig   norethindrone (AYGESTIN) 5 MG tablet TAKE 1 TABLET(5 MG) BY MOUTH DAILY   Allergies  Allergen Reactions   Adhesive [Tape]    No results found for this or any previous visit (from the past 2160 hour(s)). Objective  Body mass index is 32.21 kg/m. Wt Readings from Last 3 Encounters:  05/22/21 185 lb 3.2 oz (84 kg)  05/22/20 174 lb 9.6 oz (79.2 kg)  05/07/20 176 lb (79.8 kg)   Temp Readings from Last 3 Encounters:  05/22/21 98.1 F (36.7 C) (Oral)  05/22/20 98.7 F (37.1 C) (Oral)  07/21/18 98.5 F (36.9 C) (Oral)   BP Readings from Last 3 Encounters:  05/22/21 110/80  05/22/20 124/80  05/07/20 128/90   Pulse Readings from Last 3 Encounters:  05/22/21 67  05/22/20 64  04/17/19 60    Physical Exam Vitals and nursing note reviewed.  Constitutional:      Appearance: Normal appearance. She is well-developed and well-groomed. She is obese.  HENT:     Head:  Normocephalic and atraumatic.  Eyes:     Conjunctiva/sclera: Conjunctivae normal.     Pupils: Pupils are equal, round, and reactive to light.  Cardiovascular:     Rate and Rhythm: Normal rate and regular rhythm.     Heart sounds: Normal heart sounds. No murmur heard. Pulmonary:     Effort: Pulmonary effort is normal.     Breath sounds: Normal breath sounds.  Chest:     Chest wall: No mass.  Breasts:    Breasts are symmetrical.     Right: Normal.     Left: Normal.  Abdominal:     Tenderness: There is no abdominal tenderness.  Lymphadenopathy:     Upper Body:     Right upper body: No axillary adenopathy.     Left upper body: No axillary adenopathy.  Skin:    General: Skin is warm and dry.  Neurological:     General: No focal deficit present.     Mental Status: She is alert and oriented to person, place, and time. Mental status is at baseline.     Gait: Gait normal.  Psychiatric:        Attention and Perception: Attention and perception normal.        Mood and Affect: Mood and affect normal.        Speech: Speech normal.        Behavior: Behavior normal. Behavior is cooperative.        Thought Content: Thought content normal.        Cognition and Memory: Cognition and memory normal.        Judgment: Judgment normal.    Assessment  Plan  Annual physical exam -  Comp labs [facility] given form  Declines flu  Tdap utd 09/2017 J&J consider pfizer Given hep b vaccine 3/3 check titer immune 05/26/20 Consider prevnar vaccine   mammogram with FH breast cancer -ordered 2022 pt to call to schedule -if she wants genetic testing due to FH breast cancer in future needs counseling 1st (genetics) to get [facility] to pay for it   Pap 05/07/20 neg neg HPV Dr. Bonney Aid   Smoking increased 1/2 to <1 ppd rec cessation  Colonoscopy age 48   rec healthy diet and exercise  Nicotine dependence rec smoking cessation  Obesity (BMI  30-39.9)  Rec healthy diet and exercise  Provider: Dr.  French Ana McLean-[facility]oc[facility]

## 2021-05-22 NOTE — Patient Instructions (Addendum)
Consider prevnar Mammogram call [place] to schedule 07/10/2021   Dr. Moshe Cipro dermatology in [facility]    Pneumococcal Conjugate Vaccine (Prevnar 13) Suspension for Injection What is this medication? PNEUMOCOCCAL VACCINE (NEU mo KOK al vak SEEN) is a vaccine used to prevent pneumococcus bacterial infections. These bacteria can cause serious infections like pneumonia, meningitis, and blood infections. This vaccine will lower your chance of getting pneumonia. If you do get pneumonia, it can make your symptoms milder and your illness shorter. This vaccine will not treat an infection and will not cause infection. This vaccine is recommended for infants and young children, adults with certain medical conditions, and adults 65 years or older. This medicine may be used for other purposes; ask your health care provider or pharmacist if you have questions. COMMON BRAND NAME(S): Prevnar, Prevnar 13 What should I tell my care team before I take this medication? They need to know if you have any of these conditions: bleeding problems fever immune system problems an unusual or allergic reaction to pneumococcal vaccine, diphtheria toxoid, other vaccines, latex, other medicines, foods, dyes, or preservatives pregnant or trying to get pregnant breast-feeding How should I use this medication? This vaccine is for injection into a muscle. It is given by a health care professional. A copy of Vaccine Information Statements will be given before each vaccination. Read this sheet carefully each time. The sheet may change frequently. Talk to your pediatrician regarding the use of this medicine in children. While this drug may be prescribed for children as young as 52 weeks old for selected conditions, precautions do apply. Overdosage: If you think you have taken too much of this medicine contact a poison control center or emergency room at once. NOTE: This medicine is only for you. Do not share  this medicine with others. What if I miss a dose? It is important not to miss your dose. Call your doctor or health care professional if you are unable to keep an appointment. What may interact with this medication? medicines for cancer chemotherapy medicines that suppress your immune function steroid medicines like prednisone or cortisone This list may not describe all possible interactions. Give your health care provider a list of all the medicines, herbs, non-prescription drugs, or dietary supplements you use. Also tell them if you smoke, drink alcohol, or use illegal drugs. Some items may interact with your medicine. What should I watch for while using this medication? Mild fever and pain should go away in 3 days or less. Report any unusual symptoms to your doctor or health care professional. What side effects may I notice from receiving this medication? Side effects that you should report to your doctor or health care professional as soon as possible: allergic reactions like skin rash, itching or hives, swelling of the face, lips, or tongue breathing problems confused fast or irregular heartbeat fever over 102 degrees F seizures unusual bleeding or bruising unusual muscle weakness Side effects that usually do not require medical attention (report to your doctor or health care professional if they continue or are bothersome): aches and pains diarrhea fever of 102 degrees F or less headache irritable loss of appetite pain, tender at site where injected trouble sleeping This list may not describe all possible side effects. Call your doctor for medical advice about side effects. You may report side effects to FDA at 1-800-FDA-1088. Where should I keep my medication? This does not apply. This vaccine is given in a clinic, pharmacy, doctor's office, or other health  care setting and will not be stored at home. NOTE: This sheet is a summary. It may not cover all possible information. If  you have questions about this medicine, talk to your doctor, pharmacist, or health care provider.  2022 Elsevier/Gold Standard (2014-04-25 10:27:27)

## 2021-06-01 DIAGNOSIS — Z0000 Encounter for general adult medical examination without abnormal findings: Secondary | ICD-10-CM | POA: Insufficient documentation

## 2021-06-01 DIAGNOSIS — F17200 Nicotine dependence, unspecified, uncomplicated: Secondary | ICD-10-CM | POA: Insufficient documentation

## 2021-06-29 ENCOUNTER — Ambulatory Visit (INDEPENDENT_AMBULATORY_CARE_PROVIDER_SITE_OTHER): Payer: Managed Care, Other (non HMO) | Admitting: Obstetrics and Gynecology

## 2021-06-29 ENCOUNTER — Other Ambulatory Visit: Payer: Self-pay

## 2021-06-29 ENCOUNTER — Encounter: Payer: Self-pay | Admitting: Obstetrics and Gynecology

## 2021-06-29 VITALS — BP 112/60 | HR 93 | Ht 62.0 in | Wt 184.0 lb

## 2021-06-29 DIAGNOSIS — Z01419 Encounter for gynecological examination (general) (routine) without abnormal findings: Secondary | ICD-10-CM

## 2021-06-29 DIAGNOSIS — Z1239 Encounter for other screening for malignant neoplasm of breast: Secondary | ICD-10-CM | POA: Diagnosis not present

## 2021-06-29 DIAGNOSIS — N951 Menopausal and female climacteric states: Secondary | ICD-10-CM | POA: Diagnosis not present

## 2021-06-29 DIAGNOSIS — Z124 Encounter for screening for malignant neoplasm of cervix: Secondary | ICD-10-CM

## 2021-06-29 MED ORDER — NORETHINDRONE ACETATE 5 MG PO TABS: ORAL_TABLET | ORAL | 3 refills | Status: DC

## 2021-06-29 NOTE — Progress Notes (Signed)
Gynecology Annual Exam  PCP: McLean-Scocuzza, Pasty Spillers, MD  Chief Complaint:  Chief Complaint  Patient presents with   Gynecologic Exam    Annual - requesting labs (? Menopause). RM 5    History of Present Illness: Patient is a 45 y.o. G2P2 presents for annual exam. The patient has no complaints today.   LMP: No LMP recorded. (Menstrual status: Oral contraceptives). Amenorrhea on norethindrone for treatment of endometriosis.  Has noted some increased vasomotor symptoms particularly at night at vaginal dryness   The patient is sexually active. She currently uses tubal ligation for contraception. She has dyspareunia with certain positions.  The patient does perform self breast exams.  There is notable family history of breast or ovarian cancer in her family.  The patient wears seatbelts: yes.   The patient has regular exercise: not asked.    The patient denies current symptoms of depression.    Review of Systems: Review of Systems  Constitutional:  Negative for chills and fever.  HENT:  Negative for congestion.   Respiratory:  Negative for cough and shortness of breath.   Cardiovascular:  Negative for chest pain and palpitations.  Gastrointestinal:  Negative for abdominal pain, constipation, diarrhea, heartburn, nausea and vomiting.  Genitourinary:  Negative for dysuria, frequency and urgency.  Skin:  Negative for itching and rash.  Neurological:  Negative for dizziness and headaches.  Endo/Heme/Allergies:  Negative for polydipsia.  Psychiatric/Behavioral:  Negative for depression.    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 (Menstrual status: Oral contraceptives). Contraception: tubal ligation Last Pap: Results were: 05/07/2020 NIL and HR HPV negative  Last mammogram: 07/10/2020 Results were: BI-RAD I  Obstetric History: G2P2  Family History:  Family History  Problem Relation Age of Onset   Hypertension Mother    Cancer Mother 60       one breast/tn; recurrence with a different type of breast cancer   Stroke Mother    Breast cancer Mother 46       then dx 54    Diabetes Father    Hypertension Father    Cancer Father 38       liver cancer   Lung cancer Father    Hyperlipidemia Father    Stroke Father    Cancer Maternal Grandmother        lung cancer   Cancer Maternal Grandfather        lung cancer   Diabetes Paternal Grandfather    Hypertension Paternal Grandfather    Asthma Daughter    Anxiety disorder Daughter     Social History:  Social History  Socioeconomic History   Marital status: Married    Spouse name: Not on file   Number of children: Not on file   Years of education: Not on file   Highest education level: Not on file  Occupational History   Not on file  Tobacco Use   Smoking status: Light Smoker    Packs/day: 0.25    Years: 10.00    Pack years: 2.50    Types: Cigarettes   Smokeless tobacco: Never  Vaping Use   Vaping Use: Never used  Substance and Sexual Activity   Alcohol use: Yes    Comment: rare   Drug use: No   Sexual activity: Yes    Partners: Male    Birth  control/protection: Surgical, Pill    Comment: tubual ligation   Other Topics Concern   Not on file  Social History Narrative   Married    Works [employer]    2 kids 58 y.o and almost 2 y.o daughters as of 01/13/18    Social Determinants of Health   Financial Resource Strain: Not on file  Food Insecurity: Not on file  Transportation Needs: Not on file  Physical Activity: Not on file  Stress: Not on file  Social Connections: Not on file  Intimate Partner Violence: Not on file    Allergies:  Allergies  Allergen Reactions   Adhesive [Tape]     Medications: Prior to Admission medications   Medication Sig Start Date End Date Taking? Authorizing Provider  norethindrone (AYGESTIN) 5 MG tablet TAKE 1 TABLET(5 MG) BY MOUTH DAILY 05/12/21  Yes Vena Austria, MD  Cholecalciferol 1.25 MG (50000 UT) capsule Take 1 capsule (50,000 Units total) by mouth once a week. D3 Patient not taking: Reported on 05/22/2021 05/22/20   McLean-Scocuzza, Pasty Spillers, MD  diazepam (VALIUM) 5 MG tablet Take 1 tablet (5 mg total) by mouth 2 (two) times daily as needed for anxiety. Patient not taking: Reported on 05/22/2021 12/03/19   McLean-Scocuzza, Pasty Spillers, MD    Physical Exam Vitals: Blood pressure 112/60, pulse 93, height 5\' 2"  (1.575 m), weight 184 lb (83.5 kg).  General: NAD HEENT: normocephalic, anicteric Thyroid: no enlargement, no palpable nodules Pulmonary: No increased work of breathing, CTAB Cardiovascular: RRR, distal pulses 2+ Breast: Breast symmetrical, no tenderness, no palpable nodules or masses, no skin or nipple retraction present, no nipple discharge.  No axillary or supraclavicular lymphadenopathy. Abdomen: NABS, soft, non-tender, non-distended.  Umbilicus without lesions.  No hepatomegaly, splenomegaly or masses palpable. No evidence of hernia  Genitourinary:  External: Normal external female genitalia.  Normal urethral meatus, normal Bartholin's and Skene's glands.    Vagina: Normal  vaginal mucosa, no evidence of prolapse.    Cervix: Grossly normal in appearance, no bleeding  Uterus: Non-enlarged, mobile, normal contour.  No CMT  Adnexa: ovaries non-enlarged, no adnexal masses  Rectal: deferred  Lymphatic: no evidence of inguinal lymphadenopathy Extremities: no edema, erythema, or tenderness Neurologic: Grossly intact Psychiatric: mood appropriate, affect full  Female chaperone present for pelvic and breast  portions of the physical exam    Assessment: 45 y.o. G2P2 routine annual exam  Plan: Problem List Items Addressed This Visit   None Visit Diagnoses     Encounter for gynecological examination without abnormal finding    -  Primary   Breast screening       Perimenopausal vasomotor symptoms       Relevant Orders   Follicle stimulating hormone   Estradiol   Screening for malignant neoplasm  of cervix       Relevant Orders   IGP, Aptima HPV, rfx 16/18,45       1) Mammogram - recommend yearly screening mammogram.  Mammogram Is up to date - mammogram pending for 07/13/2021  2) STI screening  was notoffered and therefore not obtained  3) ASCCP guidelines and rational discussed.  Patient opts for every 3 years screening interval  4) Contraception - the patient is currently using  tubal ligation.  She is happy with her current form of contraception and plans to continue  5) Colonoscopy -- Screening recommended starting at age 34 for average risk individuals  6) Routine healthcare maintenance including cholesterol, diabetes screening discussed managed by PCP  7) Endometriosis continue norethindrone  8) Return in about 1 year (around 06/29/2022) for annual.   Vena Austria, MD, [facility], [facility] 06/29/2021, 3:33 PM

## 2021-06-29 NOTE — Patient Instructions (Signed)
[facility] [address] [place] [address]  [facility]  [address] [place] [address]  Phone: [phone]  

## 2021-06-30 LAB — ESTRADIOL: Estradiol: 30.3 pg/mL

## 2021-06-30 LAB — FOLLICLE STIMULATING HORMONE: FSH: 3.7 m[IU]/mL

## 2021-07-01 LAB — IGP, APTIMA HPV, RFX 16/18,45: HPV Aptima: NEGATIVE

## 2021-07-02 ENCOUNTER — Encounter: Payer: Self-pay | Admitting: Obstetrics and Gynecology

## 2021-07-10 ENCOUNTER — Encounter: Payer: Self-pay | Admitting: Obstetrics and Gynecology

## 2021-07-12 ENCOUNTER — Other Ambulatory Visit: Payer: Self-pay | Admitting: Obstetrics and Gynecology

## 2021-07-12 MED ORDER — FLUCONAZOLE 150 MG PO TABS: 150.0000 mg | ORAL_TABLET | Freq: Once | ORAL | 0 refills | Status: AC

## 2021-07-12 NOTE — Progress Notes (Signed)
Vaginal candida symptoms Rx diflucan

## 2021-07-13 ENCOUNTER — Ambulatory Visit
Admission: RE | Admit: 2021-07-13 | Discharge: 2021-07-13 | Disposition: A | Discharge status: Home / Self Care | Payer: Managed Care, Other (non HMO) | Source: Ambulatory Visit | Attending: Internal Medicine | Admitting: Internal Medicine

## 2021-07-13 ENCOUNTER — Other Ambulatory Visit: Payer: Self-pay

## 2021-07-13 DIAGNOSIS — Z1231 Encounter for screening mammogram for malignant neoplasm of breast: Secondary | ICD-10-CM | POA: Diagnosis present

## 2022-05-28 ENCOUNTER — Encounter: Payer: Managed Care, Other (non HMO) | Admitting: Internal Medicine

## 2022-06-28 ENCOUNTER — Encounter: Payer: Self-pay | Admitting: Licensed Practical Nurse

## 2022-06-28 ENCOUNTER — Ambulatory Visit (INDEPENDENT_AMBULATORY_CARE_PROVIDER_SITE_OTHER): Payer: Managed Care, Other (non HMO) | Admitting: Licensed Practical Nurse

## 2022-06-28 VITALS — BP 111/74 | HR 58 | Wt 186.0 lb

## 2022-06-28 DIAGNOSIS — R7989 Other specified abnormal findings of blood chemistry: Secondary | ICD-10-CM | POA: Diagnosis not present

## 2022-06-28 DIAGNOSIS — Z01419 Encounter for gynecological examination (general) (routine) without abnormal findings: Secondary | ICD-10-CM

## 2022-06-28 DIAGNOSIS — N809 Endometriosis, unspecified: Secondary | ICD-10-CM | POA: Diagnosis not present

## 2022-06-28 MED ORDER — NORETHINDRONE ACETATE 5 MG PO TABS: ORAL_TABLET | ORAL | 3 refills | Status: DC

## 2022-06-28 NOTE — Progress Notes (Signed)
Gynecology Annual Exam  PCP: McLean-Scocuzza, Pasty Spillers, MD  Chief Complaint:  Chief Complaint  Patient presents with   Gynecologic Exam    History of Present Illness: Patient is a 46 y.o. G2P2 presents for annual exam. The patient has experienced itching on her vulva for the past few months. -Desires Vitamin D level to be checked, has been supplementing for low Vit D -On Aygestin to suppress cycle d/t endometriosis: smoke 1/2 pack a day, BMI 34, and is 46 y/o-is aware of the increased risks while on hormonal tehrapy-has discussed with her PCP, she is not interested in stopping smoking, does not desire to stop Aygestin  -Night sweats twice a week, they are bothersome, she has tried lifestyle changes I.e. sleeping in layers, different sheets etc. Had her hormones checked last year-normal, Is aware the women in her family enter menopause early     LMP: No LMP recorded. (Menstrual status: Oral contraceptives).  Postcoital Bleeding: occasional, the bleeding coincides with when she has pain during sex-the pain in internal and to the right on or near her cervix.   The patient is sexually active with 1 female partner. She currently uses tubal ligation for contraception. She has occasional dyspareunia.  The patient does perform self breast exams.  There is notable family history of breast or ovarian cancer in her family.  The patient wears seatbelts: yes.   The patient has regular exercise: no.  Does go walking   The patient denies current symptoms of depression.  Has a history of anxiety, admits this time of year is more stressful d/t a lot of family gatherings-social event increase her anxiety  PCP: Dr French Ana, last seen 1 year ago, this MD has left the practice, [name] now looking for new PCP.  Works from home for [employer] with Husband and youngest child age 32 y.o, her 47 y/o lives nearby. 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 (Menstrual status: Oral contraceptives). Contraception: tubal ligation Last Pap: Results were: 2022 no abnormalities  Last mammogram: 2022 Results were: BI-RAD I  Obstetric History: G2P2  Family History:  Family History  Problem Relation Age of Onset   Hypertension Mother    Cancer Mother 25       one breast/tn; recurrence with a different type of breast cancer   Stroke Mother    Breast cancer Mother 31       then dx 57    Diabetes Father    Hypertension Father    Cancer Father 60  liver cancer   Lung cancer Father    Hyperlipidemia Father    Stroke Father    Cancer Maternal Grandmother        lung cancer   Cancer Maternal Grandfather        lung cancer   Diabetes Paternal Grandfather    Hypertension Paternal Grandfather    Asthma Daughter    Anxiety disorder Daughter     Social History:  Social History   Socioeconomic History   Marital status: Married    Spouse name: Not on file   Number of children: Not on file   Years of education: Not on file   Highest education level: Not on file  Occupational History   Not on file   Tobacco Use   Smoking status: Light Smoker    Packs/day: 0.25    Years: 10.00    Total pack years: 2.50    Types: Cigarettes   Smokeless tobacco: Never  Vaping Use   Vaping Use: Never used  Substance and Sexual Activity   Alcohol use: Yes    Comment: rare   Drug use: No   Sexual activity: Yes    Partners: Male    Birth control/protection: Surgical, Pill    Comment: tubual ligation   Other Topics Concern   Not on file  Social History Narrative   Married    Works [employer]    2 kids 25 y.o and almost 41 y.o daughters as of 01/13/18    Social Determinants of Health   Financial Resource Strain: Not on file  Food Insecurity: Not on file  Transportation Needs: Not on file  Physical Activity: Insufficiently Active (09/07/2017)   Exercise Vital Sign    Days of Exercise per Week: 2 days    Minutes of Exercise per Session: 20 min  Stress: No Stress Concern Present (09/07/2017)   [employer] of Occupational Health - Occupational Stress Questionnaire    Feeling of Stress : Not at all  Social Connections: Somewhat Isolated (09/07/2017)   Social Connection and Isolation Panel [NHANES]    Frequency of Communication with Friends and Family: More than three times a week    Frequency of Social Gatherings with Friends and Family: Once a week    Attends Religious Services: Never    Database administrator or Organizations: No    Attends Banker Meetings: Never    Marital Status: Married  Catering manager Violence: Not At Risk (09/07/2017)   Humiliation, Afraid, Rape, and Kick questionnaire    Fear of Current or Ex-Partner: No    Emotionally Abused: No    Physically Abused: No    Sexually Abused: No    Allergies:  Allergies  Allergen Reactions   Adhesive [Tape]     Medications: Prior to Admission medications   Medication Sig Start Date End Date Taking? Authorizing Provider  norethindrone (AYGESTIN) 5 MG tablet TAKE 1 TABLET(5 MG) BY MOUTH DAILY 06/29/21  Yes Vena Austria, MD  Cholecalciferol 1.25 MG (50000 UT) capsule Take 1 capsule (50,000 Units total) by mouth once a week. D3 Patient not taking: Reported on 05/22/2021 05/22/20   McLean-Scocuzza, Pasty Spillers, MD  diazepam (VALIUM) 5 MG tablet Take 1 tablet (5 mg total) by mouth 2 (two) times daily as needed for anxiety. Patient not taking: Reported on 05/22/2021 12/03/19   McLean-Scocuzza, Pasty Spillers, MD    Physical Exam Vitals: Blood pressure 111/74, pulse (!) 58, weight 186 lb (84.4 kg).  General: NAD HEENT: normocephalic, anicteric  Thyroid: no enlargement, no palpable nodules Pulmonary: No increased work of breathing, CTAB Cardiovascular: RRR, distal pulses 2+ Breast: Breast symmetrical, no tenderness, no palpable nodules or masses, no skin or nipple retraction present, no nipple discharge.  No axillary or supraclavicular lymphadenopathy. Abdomen: NABS, soft, non-tender, non-distended.  Umbilicus without lesions.  No hepatomegaly, splenomegaly or masses palpable. No evidence of hernia  Genitourinary:  External: Normal external female genitalia-no obvious reason for itching, pt does not shave.  Normal urethral meatus, normal Bartholin's and Skene's glands.    Vagina: Normal vaginal mucosa, no evidence of prolapse.    Cervix: Grossly normal in appearance, no bleeding, 1 cm cyst at 11 O'clock-able to palpate cyst during bimanual exam. Pt states this is where it is tender during IC.   Uterus: Non-enlarged, mobile, normal contour.  No CMT  Adnexa: ovaries non-enlarged, no adnexal masses  Rectal: deferred  Lymphatic: no evidence of inguinal lymphadenopathy Extremities: no edema, erythema, or tenderness Neurologic: Grossly intact Psychiatric: mood appropriate, affect full   Assessment: 46 y.o. G2P2 routine annual exam  Plan: Problem List Items Addressed This Visit       Other   Endometriosis - Primary   Relevant Medications   norethindrone (AYGESTIN) 5 MG tablet   Other Visit Diagnoses     Well  woman exam       Relevant Orders   VITAMIN D 25 Hydroxy (Vit-D Deficiency, Fractures)   Low vitamin D level       Relevant Orders   VITAMIN D 25 Hydroxy (Vit-D Deficiency, Fractures)       1) Mammogram - recommend yearly screening mammogram.  Mammogram Was ordered today   2) STI screening  wasoffered and declined  3) ASCCP guidelines and rational discussed.  Patient opts for every 3 years screening interval Due in 2025   4) Contraception - the patient is currently using  tubal ligation.  She is happy with her current form of contraception and plans to continue  5) Colonoscopy -- Screening recommended starting at age 62 for average risk individuals, age 37 for individuals deemed at increased risk (including African Americans) and recommended to continue until age 94.  For patient age 35-85 individualized approach is recommended.  Gold standard screening is via colonoscopy, Cologuard screening is an acceptable alternative for patient unwilling or unable to undergo colonoscopy.  "Colorectal cancer screening for average?risk adults: 2018 guideline update from the American Cancer Society"CA: A Cancer Journal for Clinicians: Dec 29, 2016   6) Routine healthcare maintenance including cholesterol, diabetes screening discussed managed by PCP  7) No follow-ups on file.  Carie Caddy, CNM  [facility], [facility] 06/28/2022, 12:40 PM

## 2022-06-29 LAB — VITAMIN D 25 HYDROXY (VIT D DEFICIENCY, FRACTURES): Vit D, 25-Hydroxy: 79.5 ng/mL (ref 30.0–100.0)

## 2022-07-15 ENCOUNTER — Other Ambulatory Visit: Payer: Self-pay | Admitting: Licensed Practical Nurse

## 2022-07-15 DIAGNOSIS — Z1231 Encounter for screening mammogram for malignant neoplasm of breast: Secondary | ICD-10-CM

## 2022-09-17 ENCOUNTER — Ambulatory Visit
Admission: RE | Admit: 2022-09-17 | Discharge: 2022-09-17 | Disposition: A | Discharge status: Home / Self Care | Payer: Managed Care, Other (non HMO) | Source: Ambulatory Visit | Attending: Licensed Practical Nurse | Admitting: Licensed Practical Nurse

## 2022-09-17 DIAGNOSIS — Z1231 Encounter for screening mammogram for malignant neoplasm of breast: Secondary | ICD-10-CM | POA: Diagnosis present

## 2023-07-06 ENCOUNTER — Ambulatory Visit: Payer: Managed Care, Other (non HMO) | Admitting: Licensed Practical Nurse

## 2023-07-13 ENCOUNTER — Encounter: Payer: Self-pay | Admitting: Licensed Practical Nurse

## 2023-07-13 ENCOUNTER — Ambulatory Visit: Payer: Managed Care, Other (non HMO) | Admitting: Licensed Practical Nurse

## 2023-07-13 VITALS — BP 126/66 | HR 59 | Ht 62.0 in | Wt 170.5 lb

## 2023-07-13 DIAGNOSIS — Z1211 Encounter for screening for malignant neoplasm of colon: Secondary | ICD-10-CM

## 2023-07-13 DIAGNOSIS — Z1231 Encounter for screening mammogram for malignant neoplasm of breast: Secondary | ICD-10-CM

## 2023-07-13 DIAGNOSIS — Z01419 Encounter for gynecological examination (general) (routine) without abnormal findings: Secondary | ICD-10-CM | POA: Diagnosis not present

## 2023-07-13 DIAGNOSIS — N809 Endometriosis, unspecified: Secondary | ICD-10-CM

## 2023-07-13 MED ORDER — NORETHINDRONE ACETATE 5 MG PO TABS: ORAL_TABLET | ORAL | 3 refills | Status: DC

## 2023-07-13 NOTE — Progress Notes (Signed)
Gynecology Annual Exam  PCP: Patient, No Pcp Per  Chief Complaint:  Chief Complaint  Patient presents with   Gynecologic Exam    History of Present Illness: Patient is a 47 y.o. G2P2 presents for annual exam. The patient has no complaints today. Denies any changes to her health hx from last visit  She would like her Vitamin D checked  LMP: No LMP recorded. (Menstrual status: Oral contraceptives). Pt on Aygestin    The patient is sexually active. She currently uses tubal ligation for contraception. She denies dyspareunia.  The patient does perform self breast exams.  There is notable family history of breast or ovarian cancer in her family.  The patient wears seatbelts: yes.   The patient has regular exercise: yes.  walking  The patient denies current symptoms of depression.  Has struggled with Anxiety, likes to do cross stitch to cope  Works at [employer] with her Husband and 27 y/o daughter, feels safe. One of her older children is currently pregnant, this will be her first grandchild Wears glasses: last eye exam 1 year ago Dental exam in the beginning of this year  PCP: [facility], wellness labs are done through work  Review of Systems: Review of Systems  Constitutional: Negative.   HENT: Negative.    Eyes: Negative.   Respiratory: Negative.    Cardiovascular: Negative.   Musculoskeletal: Negative.   Skin: Negative.   Neurological: Negative.   Endo/Heme/Allergies:        Hot flashes: they occur about at the same rate and intensity compared to last visit, pt does not appeared to be bothered by them  Psychiatric/Behavioral:  The patient is nervous/anxious.     Past Medical History:  Patient Active Problem List   Diagnosis Date Noted Date Diagnosed   Nicotine dependence, uncomplicated 06/01/2021    Annual physical exam 06/01/2021    Tobacco abuse 05/23/2020    Anxiety 05/23/2020    Obesity (BMI 30-39.9) 01/13/2018    Vitamin D deficiency 01/13/2018    Anxiety  with flying 01/13/2018    Endometriosis 10/07/2017    Nonintractable headache 10/07/2017    Family history of breast cancer 10/07/2017    Ovarian cyst 09/05/2017    Constipation 09/05/2017    Chronic right-sided low back pain without sciatica 12/01/2016    Right hip pain 04/16/2016    Cutaneous skin tags 04/16/2016     Past Surgical History:  Past Surgical History:  Procedure Laterality Date   BREAST BIOPSY Left 2006   benign, fibradenoma, clip placed   BREAST SURGERY     breast biopsy left breast   Pelvic adhesion surgery  2007   TUBAL LIGATION Bilateral 2007    Gynecologic History:  No LMP recorded. (Menstrual status: Oral contraceptives). Contraception: tubal ligation Last Pap: Results were: 2022 no abnormalities  Last mammogram: 09/2022 Results were: BI-RAD I  Obstetric History: G2P2  Family History:  Family History  Problem Relation Age of Onset   Hypertension Mother    Cancer Mother 64       one breast/tn; recurrence with a different type of breast cancer   Stroke Mother    Breast cancer Mother 42       then dx 47    Diabetes Father    Hypertension Father    Cancer Father 51       liver cancer   Lung cancer Father    Hyperlipidemia Father    Stroke Father    Cancer Maternal Grandmother  lung cancer   Cancer Maternal Grandfather        lung cancer   Diabetes Paternal Grandfather    Hypertension Paternal Grandfather    Asthma Daughter    Anxiety disorder Daughter     Social History:  Social History   Socioeconomic History   Marital status: Married    Spouse name: Not on file   Number of children: Not on file   Years of education: Not on file   Highest education level: Not on file  Occupational History   Not on file  Tobacco Use   Smoking status: Light Smoker    Current packs/day: 0.25    Average packs/day: 0.3 packs/day for 10.0 years (2.5 ttl pk-yrs)    Types: Cigarettes   Smokeless tobacco: Never  Vaping Use   Vaping status: Never  Used  Substance and Sexual Activity   Alcohol use: Yes    Comment: rare   Drug use: No   Sexual activity: Yes    Partners: Male    Birth control/protection: Surgical, Pill    Comment: tubual ligation   Other Topics Concern   Not on file  Social History Narrative   Married    Works [employer]    2 kids 46 y.o and almost 39 y.o daughters as of 01/13/18    Social Determinants of Health   Financial Resource Strain: Not on file  Food Insecurity: Not on file  Transportation Needs: Not on file  Physical Activity: Insufficiently Active (09/07/2017)   Exercise Vital Sign    Days of Exercise per Week: 2 days    Minutes of Exercise per Session: 20 min  Stress: No Stress Concern Present (09/07/2017)   [employer] of Occupational Health - Occupational Stress Questionnaire    Feeling of Stress : Not at all  Social Connections: Somewhat Isolated (09/07/2017)   Social Connection and Isolation Panel [NHANES]    Frequency of Communication with Friends and Family: More than three times a week    Frequency of Social Gatherings with Friends and Family: Once a week    Attends Religious Services: Never    Database administrator or Organizations: No    Attends Banker Meetings: Never    Marital Status: Married  Catering manager Violence: Not At Risk (09/07/2017)   Humiliation, Afraid, Rape, and Kick questionnaire    Fear of Current or Ex-Partner: No    Emotionally Abused: No    Physically Abused: No    Sexually Abused: No    Allergies:  Allergies  Allergen Reactions   Adhesive [Tape]     Medications: Prior to Admission medications   Medication Sig Start Date End Date Taking? Authorizing Provider  Cholecalciferol 1.25 MG (50000 UT) capsule Take 1 capsule (50,000 Units total) by mouth once a week. D3 Patient not taking: Reported on 05/22/2021 05/22/20   McLean-Scocuzza, Pasty Spillers, MD  diazepam (VALIUM) 5 MG tablet Take 1 tablet (5 mg total) by mouth 2 (two) times daily as needed for  anxiety. Patient not taking: Reported on 05/22/2021 12/03/19   McLean-Scocuzza, Pasty Spillers, MD  norethindrone (AYGESTIN) 5 MG tablet TAKE 1 TABLET(5 MG) BY MOUTH DAILY 07/13/23   Afiya Ferrebee, Courtney Heys, CNM    Physical Exam Vitals: Blood pressure 126/66, pulse (!) 59, height 5\' 2"  (1.575 m), weight 170 lb 8 oz (77.3 kg).  General: NAD HEENT: normocephalic, anicteric Thyroid: no enlargement, no palpable nodules Pulmonary: No increased work of breathing, CTAB Cardiovascular: RRR, distal pulses 2+ Breast: Breast  symmetrical, no tenderness, no palpable nodules or masses, no skin or nipple retraction present, no nipple discharge.  No axillary or supraclavicular lymphadenopathy. Abdomen: NABS, soft, non-tender, non-distended.  Umbilicus without lesions.  No hepatomegaly, splenomegaly or masses palpable. No evidence of hernia  Genitourinary:  External: Normal external female genitalia.  Normal urethral meatus, normal Bartholin's and Skene's glands.    Vagina: Normal vaginal mucosa, no evidence of prolapse.    Cervix: Grossly normal in appearance, no bleeding  Uterus: Non-enlarged, mobile, normal contour.  No CMT  Adnexa: ovaries non-enlarged, no adnexal masses  Rectal: deferred  Lymphatic: no evidence of inguinal lymphadenopathy Extremities: no edema, erythema, or tenderness Neurologic: Grossly intact Psychiatric: mood appropriate, affect full  Female chaperone present for pelvic and breast  portions of the physical exam    Assessment: 47 y.o. G2P2 routine annual exam  Plan: Problem List Items Addressed This Visit       Other   Endometriosis   Relevant Medications   norethindrone (AYGESTIN) 5 MG tablet   Other Visit Diagnoses     Well woman exam    -  Primary   Relevant Orders   VITAMIN D 25 Hydroxy (Vit-D Deficiency, Fractures)   MM DIGITAL SCREENING BILATERAL   Ambulatory referral to Gastroenterology   Encounter for screening mammogram for malignant neoplasm of breast        Relevant Orders   MM DIGITAL SCREENING BILATERAL   Screen for colon cancer       Relevant Orders   Ambulatory referral to Gastroenterology       1) Mammogram - recommend yearly screening mammogram.  Mammogram Was ordered today   2) STI screening  wasoffered and declined  3) ASCCP guidelines and rational discussed.  Patient opts for every 3 years screening interval Due 2025  4) Contraception - the patient is currently using  tubal ligation.  She is happy with her current form of contraception and plans to continue  5) Colonoscopy -- Screening recommended starting at age 83 for average risk individuals, age 4 for individuals deemed at increased risk (including African Americans) and recommended to continue until age 14.  For patient age 61-85 individualized approach is recommended.  Gold standard screening is via colonoscopy, Cologuard screening is an acceptable alternative for patient unwilling or unable to undergo colonoscopy.  "Colorectal cancer screening for average?risk adults: 2018 guideline update from the American Cancer Society"CA: A Cancer Journal for Clinicians: Dec 29, 2016   6) Routine healthcare maintenance including cholesterol, diabetes screening discussed managed by PCP  7) Return in about 1 year (around 07/12/2024).  Carie Caddy, CNM  [facility] 07/13/2023, 8:56 AM

## 2023-07-14 ENCOUNTER — Other Ambulatory Visit: Payer: Self-pay

## 2023-07-14 ENCOUNTER — Telehealth: Payer: Self-pay

## 2023-07-14 DIAGNOSIS — Z1211 Encounter for screening for malignant neoplasm of colon: Secondary | ICD-10-CM

## 2023-07-14 LAB — VITAMIN D 25 HYDROXY (VIT D DEFICIENCY, FRACTURES): Vit D, 25-Hydroxy: 105 ng/mL — ABNORMAL HIGH (ref 30.0–100.0)

## 2023-07-14 MED ORDER — NA SULFATE-K SULFATE-MG SULF 17.5-3.13-1.6 GM/177ML PO SOLN: 1.0000 | Freq: Once | ORAL | 0 refills | Status: AC

## 2023-07-14 NOTE — Telephone Encounter (Signed)
Gastroenterology Pre-Procedure Review  Request Date: 08/29/23 Requesting Physician: Dr. Allegra Lai  PATIENT REVIEW QUESTIONS: The patient responded to the following health history questions as indicated:    1. Are you having any GI issues? no 2. Do you have a personal history of Polyps? no 3. Do you have a family history of Colon Cancer or Polyps? no 4. Diabetes Mellitus? no 5. Joint replacements in the past 12 months?no 6. Major health problems in the past 3 months?no 7. Any artificial heart valves, MVP, or defibrillator?no    MEDICATIONS & ALLERGIES:    Patient reports the following regarding taking any anticoagulation/antiplatelet therapy:   Plavix, Coumadin, Eliquis, Xarelto, Lovenox, Pradaxa, Brilinta, or Effient? no Aspirin? no  Patient confirms/reports the following medications:  Current Outpatient Medications  Medication Sig Dispense Refill   Cholecalciferol 1.25 MG (50000 UT) capsule Take 1 capsule (50,000 Units total) by mouth once a week. D3 (Patient not taking: Reported on 07/14/2023) 13 capsule 1   diazepam (VALIUM) 5 MG tablet Take 1 tablet (5 mg total) by mouth 2 (two) times daily as needed for anxiety. (Patient not taking: Reported on 07/14/2023) 4 tablet 0   norethindrone (AYGESTIN) 5 MG tablet TAKE 1 TABLET(5 MG) BY MOUTH DAILY 90 tablet 3   No current facility-administered medications for this visit.    Patient confirms/reports the following allergies:  Allergies  Allergen Reactions   Adhesive [Tape]     No orders of the defined types were placed in this encounter.   AUTHORIZATION INFORMATION Primary Insurance: 1D#: Group #:  Secondary Insurance: 1D#: Group #:  SCHEDULE INFORMATION: Date: 08/29/23 Time: Location: [facility]

## 2023-08-04 ENCOUNTER — Telehealth: Payer: Self-pay

## 2023-08-04 ENCOUNTER — Other Ambulatory Visit: Payer: Self-pay | Admitting: Licensed Practical Nurse

## 2023-08-04 DIAGNOSIS — Z1231 Encounter for screening mammogram for malignant neoplasm of breast: Secondary | ICD-10-CM

## 2023-08-04 NOTE — Telephone Encounter (Signed)
Patient called in to reschedule her colonoscopy.

## 2023-08-05 NOTE — Telephone Encounter (Signed)
 Returned patients phone call to r/s her colonoscopy with Dr. Unk.  After discusing, she decided to postpone rescheduling because she is expecting her 1st grandbaby and the due date is sometime at the end of January but wants to make sure she is there for her daughter to help after her grandbaby is born.  Vikkie in Endo has been asked to cancel procedure for now.  Thanks,  [place], CMA

## 2023-08-29 ENCOUNTER — Ambulatory Visit: Admit: 2023-08-29 | Payer: Managed Care, Other (non HMO) | Admitting: Gastroenterology

## 2023-08-29 SURGERY — COLONOSCOPY WITH PROPOFOL
Anesthesia: General

## 2023-09-18 ENCOUNTER — Encounter: Payer: Self-pay | Admitting: *Deleted

## 2023-09-18 ENCOUNTER — Ambulatory Visit
Admission: EM | Admit: 2023-09-18 | Discharge: 2023-09-18 | Disposition: A | Discharge status: Home / Self Care | Payer: Managed Care, Other (non HMO) | Attending: Emergency Medicine | Admitting: Emergency Medicine

## 2023-09-18 DIAGNOSIS — H66005 Acute suppurative otitis media without spontaneous rupture of ear drum, recurrent, left ear: Secondary | ICD-10-CM | POA: Diagnosis not present

## 2023-09-18 DIAGNOSIS — H9202 Otalgia, left ear: Secondary | ICD-10-CM

## 2023-09-18 MED ORDER — AMOXICILLIN-POT CLAVULANATE 875-125 MG PO TABS: 1.0000 | ORAL_TABLET | Freq: Two times a day (BID) | ORAL | 0 refills | Status: DC

## 2023-09-18 MED ORDER — FLUCONAZOLE 150 MG PO TABS: 150.0000 mg | ORAL_TABLET | Freq: Once | ORAL | 0 refills | Status: AC

## 2023-09-18 NOTE — ED Provider Notes (Signed)
 Peggy Pierce    CSN: [id number] Arrival date & time: 09/18/23  0847      History   Chief Complaint Chief Complaint  Patient presents with  . Otalgia    HPI Peggy Pierce is a 48 y.o. female.    Otalgia   Past Medical History:  Diagnosis Date  . Allergy   . Asthma   . Chicken pox   . Endometriosis   . Family history of breast cancer    9/20 cancer genetic testing letter sent  . Obesity   . Vitamin D deficiency     Patient Active Problem List   Diagnosis Date Noted  . Recurrent acute suppurative otitis media without spontaneous rupture of left tympanic membrane 09/18/2023  . Acute otalgia, left 09/18/2023  . Nicotine dependence, uncomplicated 06/01/2021  . Annual physical exam 06/01/2021  . Tobacco abuse 05/23/2020  . Anxiety 05/23/2020  . Obesity (BMI 30-39.9) 01/13/2018  . Vitamin D deficiency 01/13/2018  . 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 Endometriosis 10/07/2017  . Nonintractable headache 10/07/2017  . Family history of breast cancer 10/07/2017  . Ovarian cyst 09/05/2017  . Constipation 09/05/2017  . Chronic right-sided low back pain without sciatica 12/01/2016  . Right hip pain 04/16/2016  . Cutaneous skin tags 04/16/2016    Past Surgical History:  Procedure Laterality Date  . 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 BREAST SURGERY     breast biopsy left breast  . Pelvic adhesion surgery  2007  . TUBAL LIGATION Bilateral 2007    OB History     Gravida  2   Para  2   Term      Preterm      AB      Living  2      SAB      IAB      Ectopic      Multiple      Live Births               Home Medications    Prior to Admission medications   Medication Sig Start Date End Date Taking? Authorizing Provider  amoxicillin-clavulanate (AUGMENTIN) 875-125 MG tablet Take 1 tablet by mouth every 12 (twelve) hours. 09/18/23  Yes Yosselin Zoeller, Para March, NP  Cholecalciferol 1.25 MG (50000 UT) capsule Take 1  capsule (50,000 Units total) by mouth once a week. D3 Patient not taking: Reported on 07/14/2023 05/22/20   McLean-Scocuzza, Pasty Spillers, MD  diazepam (VALIUM) 5 MG tablet Take 1 tablet (5 mg total) by mouth 2 (two) times daily as needed for anxiety. Patient not taking: Reported on 07/14/2023 12/03/19   McLean-Scocuzza, Pasty Spillers, MD  norethindrone (AYGESTIN) 5 MG tablet TAKE 1 TABLET(5 MG) BY MOUTH DAILY 07/13/23   Dominic, Courtney Heys, CNM    Family History Family History  Problem Relation Age of Onset  . Hypertension Mother   . Cancer Mother 61       one breast/tn; recurrence with a different type of breast cancer  . Stroke Mother   . Breast cancer Mother 1       then dx 76   . Diabetes Father   . Hypertension Father   . Cancer Father 67       liver cancer  . Lung cancer Father   . Hyperlipidemia Father   . Stroke Father   . Cancer Maternal Grandmother        lung cancer  . Cancer Maternal Grandfather  lung cancer  . Diabetes Paternal Grandfather   . Hypertension Paternal Grandfather   . Asthma Daughter   . Anxiety disorder Daughter     Social History Social History   Tobacco Use  . Smoking status: Light Smoker    Current packs/day: 0.25    Average packs/day: 0.3 packs/day for 10.0 years (2.5 ttl pk-yrs)    Types: Cigarettes  . Smokeless tobacco: Never  Vaping Use  . Vaping status: Never Used  Substance Use Topics  . Alcohol use: Yes    Comment: rare  . Drug use: No     Allergies   Adhesive [tape]   Review of Systems Review of Systems  HENT:  Positive for ear pain.      Physical Exam Triage Vital Signs ED Triage Vitals  Encounter Vitals Group     BP 09/18/23 0919 127/79     Systolic BP Percentile --      Diastolic BP Percentile --      Pulse Rate 09/18/23 0919 (!) 59     Resp 09/18/23 0919 16     Temp 09/18/23 0919 99.5 F (37.5 C)     Temp Source 09/18/23 0919 Oral     SpO2 09/18/23 0919 97 %     Weight 09/18/23 0917 180 lb (81.6 kg)      Height 09/18/23 0917 5\' 2"  (1.575 m)     Head Circumference --      Peak Flow --      Pain Score 09/18/23 0917 6     Pain Loc --      Pain Education --      Exclude from Growth Chart --    No data found.  Updated Vital Signs BP 127/79 (BP Location: Left Arm)   Pulse (!) 59   Temp 99.5 F (37.5 C) (Oral)   Resp 16   Ht 5\' 2"  (1.575 m)   Wt 180 lb (81.6 kg)   SpO2 97%   BMI 32.92 kg/m   Visual Acuity Right Eye Distance:   Left Eye Distance:   Bilateral Distance:    Right Eye Near:   Left Eye Near:    Bilateral Near:     Physical Exam Vitals and nursing note reviewed.  Constitutional:      General: She is not in acute distress.    Appearance: She is well-developed and well-groomed.  HENT:     Head: Normocephalic.     Right Ear: Tympanic membrane is retracted.     Left Ear: Tympanic membrane is erythematous and bulging.     Nose: Congestion present.     Mouth/Throat:     Lips: Pink.     Mouth: Mucous membranes are moist.     Pharynx: Oropharynx is clear. Uvula midline.  Eyes:     General: Lids are normal.     Conjunctiva/sclera: Conjunctivae normal.     Pupils: Pupils are equal, round, and reactive to light.  Neck:     Trachea: No tracheal deviation.  Cardiovascular:     Rate and Rhythm: Regular rhythm.     Pulses: Normal pulses.     Heart sounds: Normal heart sounds. No murmur heard. Pulmonary:     Effort: Pulmonary effort is normal.     Breath sounds: Normal breath sounds.  Abdominal:     General: Bowel sounds are normal.     Palpations: Abdomen is soft.     Tenderness: There is no abdominal tenderness.  Musculoskeletal:  General: Normal range of motion.     Cervical back: Normal range of motion.  Lymphadenopathy:     Cervical: No cervical adenopathy.  Skin:    General: Skin is warm and dry.     Findings: No rash.  Neurological:     General: No focal deficit present.     Mental Status: She is alert and oriented to person, place, and time.      GCS: GCS eye subscore is 4. GCS verbal subscore is 5. GCS motor subscore is 6.     Cranial Nerves: No cranial nerve deficit.     Sensory: No sensory deficit.  Psychiatric:        Speech: Speech normal.        Behavior: Behavior normal. Behavior is cooperative.      UC Treatments / Results  Labs (all labs ordered are listed, but only abnormal results are displayed) Labs Reviewed - No data to display  EKG   Radiology No results found.  Procedures Procedures (including critical care time)  Medications Ordered in UC Medications - No data to display  Initial Impression / Assessment and Plan / UC Course  I have reviewed the triage vital signs and the nursing notes.  Pertinent labs & imaging results that were available during my care of the patient were reviewed by me and considered in my medical decision making (see chart for details).  Clinical Course as of 09/18/23 1004  Sun Sep 18, 2023  1004 Pt requesting diflucan for yeast infection after taking antibiotics, will send in Diflucan. [JD]    Clinical Course User Index [JD] Davin Muramoto, Para March, NP   Discussed exam findings and plan of care with patient, strict go to ER precautions given.   Patient verbalized understanding to this provider.  Ddx: Recurrent acute OM, left; eustachian tube dysfunction , viral illness ,allergies Final Clinical Impressions(s) / UC Diagnoses   Final diagnoses:  Recurrent acute suppurative otitis media without spontaneous rupture of left tympanic membrane  Acute otalgia, left     Discharge Instructions      Take antibiotic as directed, do not stick anything in your ears Push fluids May take tylenol/ibuprofen as label directed for pain Follow-up with [facility] if symptoms persist Return as needed     ED Prescriptions     Medication Sig Dispense Auth. Provider   amoxicillin-clavulanate (AUGMENTIN) 875-125 MG tablet Take 1 tablet by mouth every 12 (twelve) hours.  14 tablet Gradie Ohm, Para March, NP      PDMP not reviewed this encounter.   Clancy Gourd, NP 09/18/23 [phone]

## 2023-09-18 NOTE — ED Triage Notes (Signed)
 Patient states 1 week of left ear pain and drainage.  Taking OTC Advil

## 2023-09-18 NOTE — Discharge Instructions (Addendum)
 Take antibiotic as directed, do not stick anything in your ears Push fluids May take tylenol/ibuprofen as label directed for pain Follow-up with [facility] if symptoms persist Return as needed

## 2023-09-20 ENCOUNTER — Ambulatory Visit
Admission: RE | Admit: 2023-09-20 | Discharge: 2023-09-20 | Disposition: A | Discharge status: Home / Self Care | Payer: Managed Care, Other (non HMO) | Source: Ambulatory Visit | Attending: Licensed Practical Nurse | Admitting: Licensed Practical Nurse

## 2023-09-20 DIAGNOSIS — Z1231 Encounter for screening mammogram for malignant neoplasm of breast: Secondary | ICD-10-CM | POA: Diagnosis present

## 2024-04-24 ENCOUNTER — Other Ambulatory Visit: Payer: Self-pay | Admitting: Licensed Practical Nurse

## 2024-04-24 ENCOUNTER — Ambulatory Visit

## 2024-04-24 VITALS — BP 112/84 | HR 57 | Temp 99.1°F | Ht 62.5 in | Wt 173.0 lb

## 2024-04-24 DIAGNOSIS — D229 Melanocytic nevi, unspecified: Secondary | ICD-10-CM | POA: Diagnosis not present

## 2024-04-24 DIAGNOSIS — D649 Anemia, unspecified: Secondary | ICD-10-CM | POA: Insufficient documentation

## 2024-04-24 DIAGNOSIS — N809 Endometriosis, unspecified: Secondary | ICD-10-CM

## 2024-04-24 NOTE — Progress Notes (Signed)
 Subjective:    Patient ID: Peggy Pierce, female    DOB: 20-May-1976, 48 y.o.   MRN: [id number]   Peggy Pierce is a very pleasant 48 y.o. female who presents today as a new patient.  Past medical, surgical and family history: Reviewed and updated in chart.  Allergies: Reviewed and updated in chart.  Medications: Reviewed and updated in chart.  Social history: Reviewed and updated in chart.  Last PCP and reason for leaving: [facility]  Spot on the back of R upper arm, itches since several years ago, no other lesions, scratches it, not getting bigger, getting darker,  No personsal/family hx skin can cancer, never uses sunscreen. No tanning beds,  Biopsy on R foot was benign Genital wart was excised several yrs ago     Review of Systems  All other systems reviewed and are negative.       BP 112/84 (BP Location: Left Arm, Patient Position: Sitting, Cuff Size: Large)   Pulse (!) 57   Temp 99.1 F (37.3 C) (Oral)   Ht 5' 2.5 (1.588 m)   Wt 173 lb (78.5 kg)   LMP  (LMP Unknown)   SpO2 97%   BMI 31.14 kg/m   Objective:    Physical Exam Vitals and nursing note reviewed.  Constitutional:      Appearance: Normal appearance.  HENT:     Head: Normocephalic and atraumatic.  Eyes:     Extraocular Movements: Extraocular movements intact.     Conjunctiva/sclera: Conjunctivae normal.  Skin:    General: Skin is warm.     Findings: Lesion (1 x 1 cm, raised, hyperpigmented lesion with mild scaling on the posterior upper arm.  Regular borders, symmetric, mild color variation with the left hemi-portion being darker than the right.) present.     Comments: Total-body skin exam reveals numerous benign nevi, as well as flesh-colored and hyperpigmented skin tags in the armpits  Neurological:     Mental Status: She is alert.  Psychiatric:        Mood and Affect: Mood normal.        Behavior: Behavior normal.         Assessment & Plan:   1. Benign skin mole  (Primary) New patient, past medical and social history thoroughly reviewed and updated in chart. Discussed at length about the skin mole on her arm, see physical exam above for details.  Total-body skin exam was performed as well.  Although there is some color variation with one half of the mole being darker than the other half, patient says this color arrangement has been present and consistent for the last several years and is not a new change, no changes in size, no bleeding or itching.  Given no personal/family history of skin cancer, the fact that the lesion is symmetric with regular borders, it is more likely to be a benign skin mole.  Presented options of biopsy versus monitoring, discussed extensively potential consequences which include but are not limited to, potentially missed malignancy, invasive nature of biopsy, through the process of shared decision making, the patient opts to monitor at this time.  Counseled her extensively about the warning signs that could suggest malignancy and would warrant reevaluation including evolution of the mole, change in size, change in texture, appearance of new atypical moles, patient verbalized understanding. Counseled extensively about the importance of using sunscreen to further reduce her risk of skin cancer.    Return in about 3 weeks (around 05/15/2024)  for CPE.   Raghad Lorenz K Ruthe Roemer, MD  04/24/24

## 2024-04-24 NOTE — Patient Instructions (Signed)
 Thank you for visiting [facility] today! Here's what we talked about: - Monitor arm lesion: if changes in color, size, texture, please call -Use sunscreen whenever out in the sun

## 2024-04-26 NOTE — Telephone Encounter (Signed)
 Annual due in December 2025

## 2024-05-14 ENCOUNTER — Ambulatory Visit

## 2024-05-14 VITALS — BP 112/80 | HR 72 | Temp 99.0°F | Ht 62.25 in | Wt 170.0 lb

## 2024-05-14 DIAGNOSIS — N809 Endometriosis, unspecified: Secondary | ICD-10-CM | POA: Diagnosis not present

## 2024-05-14 DIAGNOSIS — E559 Vitamin D deficiency, unspecified: Secondary | ICD-10-CM

## 2024-05-14 DIAGNOSIS — Z23 Encounter for immunization: Secondary | ICD-10-CM | POA: Diagnosis not present

## 2024-05-14 DIAGNOSIS — Z0000 Encounter for general adult medical examination without abnormal findings: Secondary | ICD-10-CM | POA: Diagnosis not present

## 2024-05-14 DIAGNOSIS — R5383 Other fatigue: Secondary | ICD-10-CM

## 2024-05-14 DIAGNOSIS — E611 Iron deficiency: Secondary | ICD-10-CM

## 2024-05-14 DIAGNOSIS — Z136 Encounter for screening for cardiovascular disorders: Secondary | ICD-10-CM

## 2024-05-14 DIAGNOSIS — Z113 Encounter for screening for infections with a predominantly sexual mode of transmission: Secondary | ICD-10-CM

## 2024-05-14 DIAGNOSIS — Z131 Encounter for screening for diabetes mellitus: Secondary | ICD-10-CM

## 2024-05-14 DIAGNOSIS — E010 Iodine-deficiency related diffuse (endemic) goiter: Secondary | ICD-10-CM

## 2024-05-14 MED ORDER — NORETHINDRONE ACETATE 5 MG PO TABS: ORAL_TABLET | ORAL | 3 refills | Status: AC

## 2024-05-14 NOTE — Progress Notes (Signed)
 Assessment & Plan:   This visit was conducted in person. The patient gave informed consent to the use of [employer] technology to record the contents of the encounter as documented below.  Assessment and Plan Assessment & Plan Annual physical  Age-appropriate screening, counseling and vaccines discussed with patient. Healthy diet and exercise recommendations given.  Generally well with insufficient exercise and occasional missed meals due to a busy schedule. Family history of breast cancer in 1 first-degree relative, mother. Up to date with eye exams and immunizations. Due for colonoscopy (will call Dr. Kayla office to schedule) and routine labs ordered below. Prefers annual mammograms due to family history. No Pap smear needed until 2027 based on 2022 results, which showed normal cytology and negative HPV.  - Encourage walking 3-5 times a week for 30 minutes at a brisk pace. - Encourage balanced meals with half the plate as vegetables, a quarter protein, and a quarter carbohydrates. - Schedule a dental visit. - Order routine labs including cholesterol, A1c, HIV, hepatitis C, thyroid, iron, and vitamin D  levels. - Schedule colonoscopy with Dr. Kayla office. - Order mammogram for February 2026.  Endometriosis Sx well-controlled with norethindrone , no changes to regimen at this time. - Continue norethindrone  as prescribed. - Send refill for norethindrone  to [facility].  Iron deficiency Not currently on supplement, given history will order iron labs and go from there. - Order iron labs.  Vitamin D  deficiency Previously high levels after supplementation, per chart review vitamin D  level was significantly elevated at 105 in December 2024, supplement has since been discontinued, will order to see what patient is at now. - Order vitamin D  level.  Thyromegaly Fatigue Thyroid was particularly prominent on physical exam, given complaints of fatigue, will order TSH and go from  there.  - TSH ordered  Need for immunization against influenza Flu vaccine administered this visit   Follow-up: Return in about 1 year (around 05/14/2025) for CPE.       Subjective:   Patient ID: Peggy Pierce, female    DOB: Aug 30, 1975  Age: 48 y.o. MRN: [id number]  Patient Care Team: Bennett Reuben POUR, MD as PCP - General (Family Medicine)   CC:  Chief Complaint  Patient presents with   Annual Exam    Asking if taking over all of woman health includes taking over norethindrone  for her Endometriosis.     History of Present Illness Peggy Pierce is a 48 year old female who presents for a routine physical exam and follow-up on her endometriosis and other health maintenance.  She is currently taking norethindrone  for endometriosis, with symptoms well controlled. She receives a three-month supply with three refills, providing a year's supply.  She has a history of low iron and vitamin D  deficiency. She is not currently taking a vitamin D  supplement as her levels were previously high.  There is a family history of breast cancer, with her mother diagnosed at 68 and again at 96. She prefers to have annual mammograms due to this history. She had her last mammogram in February 2025.  She had COVID at the end of 2023, which affected her and her daughter significantly, leading to a cautious approach to healthcare visits during the pandemic.  She is trying to be more active, particularly with walking, now that the weather is cooler. 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 She acknowledges that she is not as active  as she would like to be.  Her eating habits are inconsistent, often forgetting to eat due to a busy schedule. When she does eat, her meals are balanced with protein, vegetables, and a starch, avoiding extra breads and pastas. She follows a 'rule of halves' for her meals, ensuring half of her plate is  vegetables.  No concerns with mood, anxiety, or feeling down, although she notes feeling exhausted due to a busy period in her life. She has not seen a dentist in the past year but has had her eyes checked within the last two years. She feels safe at home.    Advanced Directives Patient  have advanced directives. She does not have a copy in the electronic medical record.    Depression Screening;    05/14/2024   12:12 PM 04/24/2024    9:11 AM 05/22/2021    9:41 AM 05/22/2020    2:00 PM 05/23/2018    3:17 PM 01/13/2018    4:15 PM 10/07/2017    3:42 PM  PHQ 2/9 Scores  PHQ - 2 Score 0 0 1 0 0 0 0     Anxiety Screening:    05/22/2020    2:00 PM  GAD 7 : Generalized Anxiety Score  Nervous, Anxious, on Edge 0  Control/stop worrying 0  Worry too much - different things 0  Trouble relaxing 0  Restless 0  Easily annoyed or irritable 0  Afraid - awful might happen 0  Total GAD 7 Score 0  Anxiety Difficulty Not difficult at all     ROS: Negative unless specifically indicated above in HPI.       Objective:     BP 112/80 (BP Location: Left Arm, Patient Position: Sitting, Cuff Size: Large)   Pulse 72   Temp 99 F (37.2 C) (Oral)   Ht 5' 2.25 (1.581 m)   Wt 170 lb (77.1 kg)   LMP  (LMP Unknown) Comment: 5-7 years ago  SpO2 98%   BMI 30.84 kg/m    Physical Exam GENERAL: Alert, cooperative, well developed, no acute distress. HEENT: Normocephalic, normal oropharynx, moist mucous membranes, cerumen present in ears not obstructing eardrum. NECK: Thyroid feels prominent, no cervical adenopathy. CHEST: Clear to auscultation bilaterally, no wheezes, rhonchi, or crackles. CARDIOVASCULAR: Normal heart rate and rhythm, S1 and S2 normal without murmurs. ABDOMEN: Soft, non-tender, non-distended, without organomegaly, normal bowel sounds. EXTREMITIES: No cyanosis or edema. NEUROLOGICAL: Cranial nerves grossly intact, moves all extremities without gross motor or sensory deficit.        Mackensie Pilson K Denver Harder, MD  05/14/24    Contains text generated by [employer].

## 2024-05-14 NOTE — Patient Instructions (Addendum)
 Thank you for visiting [facility] today! Here's what we talked about: - Have your labs done - Call Dr. Kayla office to schedule your colonoscopy - Come back in 1 year for physical

## 2024-05-24 LAB — HEMOGLOBIN A1C
Est. average glucose Bld gHb Est-mCnc: 105 mg/dL
Hgb A1c MFr Bld: 5.3 % (ref 4.8–5.6)

## 2024-05-24 LAB — LIPID PANEL
Chol/HDL Ratio: 4.1 ratio (ref 0.0–4.4)
Cholesterol, Total: 140 mg/dL (ref 100–199)
HDL: 34 mg/dL — ABNORMAL LOW (ref >39)
LDL Chol Calc (NIH): 93 mg/dL (ref 0–99)
Triglycerides: 63 mg/dL (ref 0–149)
VLDL Cholesterol Cal: 13 mg/dL (ref 5–40)

## 2024-05-24 LAB — HIV ANTIBODY (ROUTINE TESTING W REFLEX): HIV Screen 4th Generation wRfx: NONREACTIVE

## 2024-05-24 LAB — IRON,TIBC AND FERRITIN PANEL
Ferritin: 151 ng/mL — ABNORMAL HIGH (ref 15–150)
Iron Saturation: 16 % (ref 15–55)
Iron: 61 ug/dL (ref 27–159)
Total Iron Binding Capacity: 387 ug/dL (ref 250–450)
UIBC: 326 ug/dL (ref 131–425)

## 2024-05-24 LAB — HEPATITIS C ANTIBODY: Hep C Virus Ab: NONREACTIVE

## 2024-05-24 LAB — TSH: TSH: 1.96 u[IU]/mL (ref 0.450–4.500)

## 2024-05-24 LAB — VITAMIN D 25 HYDROXY (VIT D DEFICIENCY, FRACTURES): Vit D, 25-Hydroxy: 37.7 ng/mL (ref 30.0–100.0)

## 2024-05-28 ENCOUNTER — Ambulatory Visit: Payer: Self-pay

## 2025-05-15 ENCOUNTER — Encounter
